# Patient Record
Sex: Female | Born: 1960 | Race: White | Hispanic: No | Marital: Married | Smoking: Never smoker
Health system: Southern US, Community
[De-identification: ages and names within clinical notes are randomized; demographics above are authoritative.]

## PROBLEM LIST (undated history)

## (undated) DIAGNOSIS — M1990 Unspecified osteoarthritis, unspecified site: Secondary | ICD-10-CM

## (undated) DIAGNOSIS — D649 Anemia, unspecified: Secondary | ICD-10-CM

## (undated) DIAGNOSIS — T4145XA Adverse effect of unspecified anesthetic, initial encounter: Secondary | ICD-10-CM

## (undated) DIAGNOSIS — H33309 Unspecified retinal break, unspecified eye: Secondary | ICD-10-CM

## (undated) DIAGNOSIS — T8859XA Other complications of anesthesia, initial encounter: Secondary | ICD-10-CM

## (undated) HISTORY — PX: COLONOSCOPY: SHX174

## (undated) HISTORY — PX: EYE SURGERY: SHX253

## (undated) HISTORY — PX: TRIGGER FINGER RELEASE: SHX641

## (undated) HISTORY — PX: CHOLECYSTECTOMY: SHX55

---
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
## 2005-11-01 ENCOUNTER — Ambulatory Visit: Payer: Self-pay | Admitting: Internal Medicine

## 2008-04-09 ENCOUNTER — Ambulatory Visit: Payer: Self-pay | Admitting: Family Medicine

## 2010-04-22 ENCOUNTER — Ambulatory Visit: Payer: Self-pay | Admitting: Family Medicine

## 2012-07-23 ENCOUNTER — Ambulatory Visit: Payer: Self-pay | Admitting: Internal Medicine

## 2012-09-17 ENCOUNTER — Ambulatory Visit: Payer: Self-pay | Admitting: Internal Medicine

## 2013-06-06 ENCOUNTER — Emergency Department (HOSPITAL_COMMUNITY)
Admission: EM | Admit: 2013-06-06 | Discharge: 2013-06-06 | Disposition: A | Discharge status: Home / Self Care | Payer: BC Managed Care – PPO | Attending: Emergency Medicine | Admitting: Emergency Medicine

## 2013-06-06 ENCOUNTER — Encounter (HOSPITAL_COMMUNITY): Payer: Self-pay | Admitting: *Deleted

## 2013-06-06 DIAGNOSIS — H43391 Other vitreous opacities, right eye: Secondary | ICD-10-CM

## 2013-06-06 DIAGNOSIS — Z7982 Long term (current) use of aspirin: Secondary | ICD-10-CM | POA: Insufficient documentation

## 2013-06-06 DIAGNOSIS — Z79899 Other long term (current) drug therapy: Secondary | ICD-10-CM | POA: Insufficient documentation

## 2013-06-06 DIAGNOSIS — H43399 Other vitreous opacities, unspecified eye: Secondary | ICD-10-CM | POA: Insufficient documentation

## 2013-06-06 DIAGNOSIS — Z9889 Other specified postprocedural states: Secondary | ICD-10-CM | POA: Insufficient documentation

## 2013-06-06 DIAGNOSIS — Z8669 Personal history of other diseases of the nervous system and sense organs: Secondary | ICD-10-CM | POA: Insufficient documentation

## 2013-06-06 DIAGNOSIS — Z880 Allergy status to penicillin: Secondary | ICD-10-CM | POA: Insufficient documentation

## 2013-06-06 DIAGNOSIS — H539 Unspecified visual disturbance: Secondary | ICD-10-CM | POA: Insufficient documentation

## 2013-06-06 HISTORY — DX: Unspecified retinal break, unspecified eye: H33.309

## 2013-06-06 NOTE — ED Provider Notes (Signed)
History     CSN: [id number]  Arrival date & time 06/06/13  2038   First MD Initiated Contact with Patient 06/06/13 2144      Chief Complaint  Patient presents with  . Eye Injury    (Consider location/radiation/quality/duration/timing/severity/associated sxs/prior treatment) Patient is a 52 y.o. female presenting with eye injury. The history is provided by the patient.  Eye Injury This is a new problem. The current episode started today. The problem occurs constantly. The problem has been unchanged. Associated symptoms include a visual change (flashes and floaters, no blurry vision). Pertinent negatives include no abdominal pain, chest pain, chills, fever, headaches, nausea, numbness, vomiting or weakness. Nothing aggravates the symptoms. She has tried nothing for the symptoms.    Past Medical History  Diagnosis Date  . Retinal defect, unspecified     Past Surgical History  Procedure Laterality Date  . Eye surgery    . Cholecystectomy      History reviewed. No pertinent family history.  History  Substance Use Topics  . Smoking status: Never Smoker   . Smokeless tobacco: Not on file  . Alcohol Use: No    OB History   Grav Para Term Preterm Abortions TAB SAB Ect Mult Living                  Review of Systems  Constitutional: Negative for fever and chills.  Eyes: Positive for visual disturbance. Negative for photophobia, pain, discharge and redness.  Respiratory: Negative for chest tightness and shortness of breath.   Cardiovascular: Negative for chest pain.  Gastrointestinal: Negative for nausea, vomiting, abdominal pain and diarrhea.  Genitourinary: Negative for dysuria.  Neurological: Negative for dizziness, weakness, numbness and headaches.  All other systems reviewed and are negative.    Allergies  Penicillins and Sulfa antibiotics  Home Medications   Current Outpatient Rx  Name  Route  Sig  Dispense  Refill  . aspirin 81 MG tablet   Oral   Take 81  mg by mouth daily.         Marland Kitchen CALCIUM PO   Oral   Take 1 tablet by mouth daily.         Marland Kitchen MAGNESIUM PO   Oral   Take 1 tablet by mouth daily.         . Multiple Vitamins-Minerals (MULTIVITAMIN WITH MINERALS) tablet   Oral   Take 1 tablet by mouth daily.         . naproxen (NAPROSYN) 500 MG tablet   Oral   Take 500 mg by mouth 2 (two) times daily with a meal.         . Potassium (POTASSIMIN PO)   Oral   Take 1 tablet by mouth daily.           BP 115/87  Pulse 65  Temp(Src) 98.3 F (36.8 C) (Oral)  Resp 16  SpO2 98%  Physical Exam  Nursing note and vitals reviewed. Constitutional: She is oriented to person, place, and time. She appears well-developed and well-nourished. No distress.  HENT:  Head: Normocephalic and atraumatic.  Mouth/Throat: Oropharynx is clear and moist.  Eyes: EOM are normal. Pupils are equal, round, and reactive to light.  Visual acuity 20/25 in right eye. No pain with indirect consensual response. Conjunctivae normal. Unable to visualize retinal detachment or hemorrhage, fundoscopic exam with no abnormalities.   Neck: Normal range of motion. Neck supple.  Cardiovascular: Normal rate, regular rhythm and normal heart sounds.  Exam reveals no  friction rub.   No murmur heard. Pulmonary/Chest: Effort normal and breath sounds normal. No respiratory distress. She has no wheezes. She has no rales.  Abdominal: Soft. There is no tenderness. There is no rebound and no guarding.  Musculoskeletal: Normal range of motion. She exhibits no edema and no tenderness.  Lymphadenopathy:    She has no cervical adenopathy.  Neurological: She is alert and oriented to person, place, and time.  Skin: Skin is warm and dry. No rash noted.  Psychiatric: She has a normal mood and affect. Her behavior is normal.    ED Course  Procedures (including critical care time)  Labs Reviewed - No data to display No results found.   1. Floaters, right   2. Visual  disturbance       MDM  27:11 PM 52 year old female is no significant past medical history presenting with sudden onset of flashes and floaters in her right eye roughly 2 hours prior to arrival. The flashes have resolved but the floaters persist. She denies eye pain, visual change, headache, nausea or vomiting. No history of similar symptoms in the past. She appears well on exam. Visual acuity is intact. Exam not consistent with glaucoma. Discussed with ophthalmologist, Dr. Randon Goldsmith over the phone. Recommended followup in the morning for dilated exam. Consider posterior vitreous detachment versus retinal detachment. Discussed with patient and she is understanding of plan. His clinic will contact her tomorrow morning. She was provided clinic number if she does not hear from them. She was given return precautions and discharged home stable condition.        Caren Hazy, MD 06/06/13 2322

## 2013-06-06 NOTE — ED Notes (Addendum)
Pt has his of retinal issues.  States that she had flashes and a pop in her (R) eye.  Pt was told by her eye doctor to come to the ED.  Pt now reports seeing 'blood streaks and black spots' in her (R) eye.  Vision in (L) eye is normal.  Pt reports being able to see out of her (R) eye but vision is hazy.  Pt denies HA.  PERRLA.

## 2013-06-09 NOTE — ED Provider Notes (Signed)
I saw and evaluated the patient, reviewed the resident's note and I agree with the findings and plan.  Patient presents with painless vision change associated with flashers and floaters. Suspect vitreous detachment versus retinal detachment. Arrangements made for followup with ophthalmology.  Gilda Crease, MD 06/09/13 [phone]

## 2016-08-29 DIAGNOSIS — C4491 Basal cell carcinoma of skin, unspecified: Secondary | ICD-10-CM

## 2016-08-29 HISTORY — DX: Basal cell carcinoma of skin, unspecified: C44.91

## 2017-04-06 ENCOUNTER — Emergency Department: Payer: Self-pay

## 2017-04-06 ENCOUNTER — Emergency Department
Admission: EM | Admit: 2017-04-06 | Discharge: 2017-04-06 | Disposition: A | Discharge status: Home / Self Care | Payer: BLUE CROSS/BLUE SHIELD | Attending: Emergency Medicine | Admitting: Emergency Medicine

## 2017-04-06 ENCOUNTER — Telehealth: Payer: Self-pay

## 2017-04-06 ENCOUNTER — Encounter: Payer: Self-pay | Admitting: Emergency Medicine

## 2017-04-06 DIAGNOSIS — R1031 Right lower quadrant pain: Secondary | ICD-10-CM | POA: Diagnosis present

## 2017-04-06 DIAGNOSIS — Z79899 Other long term (current) drug therapy: Secondary | ICD-10-CM | POA: Diagnosis not present

## 2017-04-06 DIAGNOSIS — R109 Unspecified abdominal pain: Secondary | ICD-10-CM

## 2017-04-06 DIAGNOSIS — R110 Nausea: Secondary | ICD-10-CM | POA: Diagnosis not present

## 2017-04-06 DIAGNOSIS — Z7982 Long term (current) use of aspirin: Secondary | ICD-10-CM | POA: Insufficient documentation

## 2017-04-06 LAB — COMPREHENSIVE METABOLIC PANEL
ALT: 34 U/L (ref 14–54)
AST: 29 U/L (ref 15–41)
Albumin: 4.6 g/dL (ref 3.5–5.0)
Alkaline Phosphatase: 86 U/L (ref 38–126)
Anion gap: 8 (ref 5–15)
BUN: 17 mg/dL (ref 6–20)
CALCIUM: 9.9 mg/dL (ref 8.9–10.3)
CO2: 24 mmol/L (ref 22–32)
CREATININE: 0.86 mg/dL (ref 0.44–1.00)
Chloride: 104 mmol/L (ref 101–111)
Glucose, Bld: 113 mg/dL — ABNORMAL HIGH (ref 65–99)
Potassium: 4.1 mmol/L (ref 3.5–5.1)
Sodium: 136 mmol/L (ref 135–145)
Total Bilirubin: 0.5 mg/dL (ref 0.3–1.2)
Total Protein: 8.2 g/dL — ABNORMAL HIGH (ref 6.5–8.1)

## 2017-04-06 LAB — CBC
HCT: 40.7 % (ref 35.0–47.0)
Hemoglobin: 13.7 g/dL (ref 12.0–16.0)
MCH: 27.8 pg (ref 26.0–34.0)
MCHC: 33.7 g/dL (ref 32.0–36.0)
MCV: 82.5 fL (ref 80.0–100.0)
PLATELETS: 333 10*3/uL (ref 150–440)
RBC: 4.94 MIL/uL (ref 3.80–5.20)
RDW: 14.7 % — ABNORMAL HIGH (ref 11.5–14.5)
WBC: 13.8 10*3/uL — ABNORMAL HIGH (ref 3.6–11.0)

## 2017-04-06 LAB — URINALYSIS, COMPLETE (UACMP) WITH MICROSCOPIC
Bilirubin Urine: NEGATIVE
GLUCOSE, UA: NEGATIVE mg/dL
HGB URINE DIPSTICK: NEGATIVE
KETONES UR: NEGATIVE mg/dL
LEUKOCYTES UA: NEGATIVE
Nitrite: NEGATIVE
Protein, ur: NEGATIVE mg/dL
Specific Gravity, Urine: 1.003 — ABNORMAL LOW (ref 1.005–1.030)
pH: 6 (ref 5.0–8.0)

## 2017-04-06 LAB — LIPASE, BLOOD: Lipase: 18 U/L (ref 11–51)

## 2017-04-06 NOTE — ED Notes (Signed)
See triage note.  Patient sent by pcp for eval of right lower quad discomfort.  She had ct done already and has disc with her.  Pt in nad. Came from work.

## 2017-04-06 NOTE — Consult Note (Signed)
Date of Consultation:  04/06/2017  Requesting Physician:  Charlotte Crumb, MD  Reason for Consultation:  Abdominal pain  History of Present Illness: Holly Holloway is a 56 y.o. female who presents with a one month history of right lower quadrant dull pain.  She reports that her pain started suddenly a month ago.  She was uncertain what it was and took two vicodins and the pain improved to a low-grade pain.  However, this has persisted and not fully resolved.  She reports that she has mild nausea in the morning when she first wakes up, but otherwise no other symptoms.  Denies any fevers or chills.  Denies any other nausea or vomiting.  Denies other areas of abdominal pain, constipation, diarrhea.  Does have occasional blood per rectum but she attributes it to an anal fissure.  Denies any dysuria or hematuria.  Denies any vaginal discharge.  The pain does not radiate.  She went to her PCP about 1-2 weeks ago and had a CT scan ordered, which was done yesterday.  Then the patient had a phone call this morning stating that her CT scan showed appendicitis and the patient was told to come to ED for further evaluation.   Past Medical History: Past Medical History:  Diagnosis Date  . Retinal defect, unspecified      Past Surgical History: Past Surgical History:  Procedure Laterality Date  . CHOLECYSTECTOMY    . EYE SURGERY      Home Medications: Prior to Admission medications   Medication Sig Start Date End Date Taking? Authorizing Provider  aspirin 81 MG tablet Take 81 mg by mouth daily.    Historical Provider, MD  CALCIUM PO Take 1 tablet by mouth daily.    Historical Provider, MD  MAGNESIUM PO Take 1 tablet by mouth daily.    Historical Provider, MD  Multiple Vitamins-Minerals (MULTIVITAMIN WITH MINERALS) tablet Take 1 tablet by mouth daily.    Historical Provider, MD  naproxen (NAPROSYN) 500 MG tablet Take 500 mg by mouth 2 (two) times daily with a meal.    Historical Provider, MD   Potassium (POTASSIMIN PO) Take 1 tablet by mouth daily.    Historical Provider, MD    Allergies: Allergies  Allergen Reactions  . Penicillins     Hives   . Sulfa Antibiotics     hives    Social History:  reports that she has never smoked. She has never used smokeless tobacco. She reports that she does not drink alcohol or use drugs.   Family History: --No family history of IBD.  Review of Systems: Review of Systems  Constitutional: Negative for chills and fever.  HENT: Negative for hearing loss.   Eyes: Negative for blurred vision.  Respiratory: Negative for cough and shortness of breath.   Cardiovascular: Negative for chest pain and leg swelling.  Gastrointestinal: Negative for abdominal pain, constipation, diarrhea, nausea and vomiting.  Genitourinary: Negative for dysuria and hematuria.  Musculoskeletal: Negative for myalgias.  Skin: Negative for rash.  Neurological: Negative for dizziness.  Psychiatric/Behavioral: Negative for depression.  All other systems reviewed and are negative.   Physical Exam BP (!) 145/80 (BP Location: Left Arm)   Pulse 71   Temp 98.1 F (36.7 C) (Oral)   Resp 18   Ht 5\' 8"  (1.727 m)   Wt 97.5 kg (215 lb)   SpO2 93%   BMI 32.69 kg/m  CONSTITUTIONAL: No acute distress HEENT:  Normocephalic, atraumatic, extraocular motion intact. NECK: Trachea is midline, and  there is no jugular venous distension.  RESPIRATORY:  Lungs are clear, and breath sounds are equal bilaterally. Normal respiratory effort without pathologic use of accessory muscles. CARDIOVASCULAR: Heart is regular without murmurs, gallops, or rubs. GI: The abdomen is soft, non-distended, with mild discomfort with deep palpation of the right lower quadrant.  Describes as 2/10 pain.  No peritoneal signs. There were no palpable masses.  MUSCULOSKELETAL:  Normal muscle strength and tone in all four extremities.  No peripheral edema or cyanosis. SKIN: Skin turgor is normal. There are  no pathologic skin lesions.  NEUROLOGIC:  Motor and sensation is grossly normal.  Cranial nerves are grossly intact. PSYCH:  Alert and oriented to person, place and time. Affect is normal.  Laboratory Analysis: Results for orders placed or performed during the hospital encounter of 04/06/17 (from the past 24 hour(s))  Lipase, blood     Status: None   Collection Time: 04/06/17 12:48 PM  Result Value Ref Range   Lipase 18 11 - 51 U/L  Comprehensive metabolic panel     Status: Abnormal   Collection Time: 04/06/17 12:48 PM  Result Value Ref Range   Sodium 136 135 - 145 mmol/L   Potassium 4.1 3.5 - 5.1 mmol/L   Chloride 104 101 - 111 mmol/L   CO2 24 22 - 32 mmol/L   Glucose, Bld 113 (H) 65 - 99 mg/dL   BUN 17 6 - 20 mg/dL   Creatinine, Ser 0.86 0.44 - 1.00 mg/dL   Calcium 9.9 8.9 - 10.3 mg/dL   Total Protein 8.2 (H) 6.5 - 8.1 g/dL   Albumin 4.6 3.5 - 5.0 g/dL   AST 29 15 - 41 U/L   ALT 34 14 - 54 U/L   Alkaline Phosphatase 86 38 - 126 U/L   Total Bilirubin 0.5 0.3 - 1.2 mg/dL   GFR calc non Af Amer >60 >60 mL/min   GFR calc Af Amer >60 >60 mL/min   Anion gap 8 5 - 15  CBC     Status: Abnormal   Collection Time: 04/06/17 12:48 PM  Result Value Ref Range   WBC 13.8 (H) 3.6 - 11.0 K/uL   RBC 4.94 3.80 - 5.20 MIL/uL   Hemoglobin 13.7 12.0 - 16.0 g/dL   HCT 40.7 35.0 - 47.0 %   MCV 82.5 80.0 - 100.0 fL   MCH 27.8 26.0 - 34.0 pg   MCHC 33.7 32.0 - 36.0 g/dL   RDW 14.7 (H) 11.5 - 14.5 %   Platelets 333 150 - 440 K/uL  Urinalysis, Complete w Microscopic     Status: Abnormal   Collection Time: 04/06/17 12:48 PM  Result Value Ref Range   Color, Urine STRAW (A) YELLOW   APPearance CLEAR (A) CLEAR   Specific Gravity, Urine 1.003 (L) 1.005 - 1.030   pH 6.0 5.0 - 8.0   Glucose, UA NEGATIVE NEGATIVE mg/dL   Hgb urine dipstick NEGATIVE NEGATIVE   Bilirubin Urine NEGATIVE NEGATIVE   Ketones, ur NEGATIVE NEGATIVE mg/dL   Protein, ur NEGATIVE NEGATIVE mg/dL   Nitrite NEGATIVE NEGATIVE    Leukocytes, UA NEGATIVE NEGATIVE   RBC / HPF 0-5 0 - 5 RBC/hpf   WBC, UA 0-5 0 - 5 WBC/hpf   Bacteria, UA RARE (A) NONE SEEN   Squamous Epithelial / LPF 0-5 (A) NONE SEEN    Imaging: CT scan from outside facility imported onto EPIC, showing a dilated proximal appendix, with very mild surrounding stranding.  Assessment and Plan: This is  a 56 y.o. female who presents with a one-month history of abdominal pain.  I have independently viewed the patient's imaging and laboratory studies.  Her CT scan is fairly unremarkable, with only minimal periappendiceal stranding.  Oral contrast flows through small bowel without any evidence of obstruction.  There is no significant free fluid.  She's s/p cholecystectomy.  She does have a mildly elevated WBC but this is of unclear etiology.  Given the duration of symptoms and low-grade quality, this is not suspicious for acute appendicitis.  Discussed with the patient the overall progression of appendicitis pathology and her symptoms do not correlate.  Uncertain of the etiology of her current low-grade dull pain.  Could potentially be gynecological in origin, and she may benefit from a pelvic or transvaginal ultrasound for better evaluation.  Overall, there are no acute surgical needs at this point and the patient does not need admission to surgical service.  Did discuss with the patient that if her symptoms were to worsen, to seek medical attention promptly.   Melvyn Neth, [place]

## 2017-04-06 NOTE — Telephone Encounter (Signed)
Primary care Physician called with patient's CT results of acute appendicitis. Physician was instructed to send patient straight to Emergency Department at [facility]. Dr. Hampton Abbot (Surgeon) has been made aware that patient is en route to ED.

## 2017-04-06 NOTE — ED Notes (Signed)
Pt discharged home after verbalizing understanding of discharge instructions; nad noted. 

## 2017-04-06 NOTE — ED Provider Notes (Signed)
[facility] Emergency Department Provider Note  ____________________________________________   I have reviewed the triage vital signs and the nursing notes.   HISTORY  Chief Complaint Abdominal Pain    HPI Holly Holloway is a 56 y.o. female who had gallbladder surgery years ago otherwise has never had abdominal surgery she states. She's had right lower quadrant pain on for a month. She has significant episode of right lower quadrant pain one month ago and then gradually the pain has persisted but not worsening. She has had no fever no vomiting and the pain is a 1 or 2 out of 10. She denies any dysuria or urinary frequency. She's had slightly loose stools. She denies any headache, she denies any cough or chest pain or shortness of breath. This is lower abdominal pain. She went to her primary care doctor about a week ago, they ordered an outpatient CT scan which was done yesterday. They called her today and told her she had appendicitis. Patient was hoping she states to be met by the surgeon at the door of the emergency room. She is upset that this does not happen. Apparently, triage called surgery and surgery stated that they wanted her seen in the emergency room first. This is not a direct admit. Before taking care of the patient therefore I did explain to her that there would be ER charges and she did elect to stay in the emergency department.  Past Medical History:  Diagnosis Date  . Retinal defect, unspecified     There are no active problems to display for this patient.   Past Surgical History:  Procedure Laterality Date  . CHOLECYSTECTOMY    . EYE SURGERY      Prior to Admission medications   Medication Sig Start Date End Date Taking? Authorizing Provider  aspirin 81 MG tablet Take 81 mg by mouth daily.    Historical Provider, MD  CALCIUM PO Take 1 tablet by mouth daily.    Historical Provider, MD  MAGNESIUM PO Take 1 tablet by mouth daily.     Historical Provider, MD  Multiple Vitamins-Minerals (MULTIVITAMIN WITH MINERALS) tablet Take 1 tablet by mouth daily.    Historical Provider, MD  naproxen (NAPROSYN) 500 MG tablet Take 500 mg by mouth 2 (two) times daily with a meal.    Historical Provider, MD  Potassium (POTASSIMIN PO) Take 1 tablet by mouth daily.    Historical Provider, MD    Allergies Penicillins and Sulfa antibiotics  History reviewed. No pertinent family history.  Social History Social History  Substance Use Topics  . Smoking status: Never Smoker  . Smokeless tobacco: Never Used  . Alcohol use No    Review of Systems Constitutional: No fever/chills Eyes: No visual changes. ENT: No sore throat. No stiff neck no neck pain Cardiovascular: Denies chest pain. Respiratory: Denies shortness of breath. Gastrointestinal:   no vomiting.  No diarrhea.  No constipation. Genitourinary: Negative for dysuria. Musculoskeletal: Negative lower extremity swelling Skin: Negative for rash. Neurological: Negative for severe headaches, focal weakness or numbness. 10-point ROS otherwise negative.  ____________________________________________   PHYSICAL EXAM:  VITAL SIGNS: ED Triage Vitals  Enc Vitals Group     BP 04/06/17 1249 (!) 145/80     Pulse Rate 04/06/17 1249 71     Resp 04/06/17 1249 18     Temp 04/06/17 1249 98.1 F (36.7 C)     Temp Source 04/06/17 1249 Oral     SpO2 04/06/17 1249 93 %  Weight 04/06/17 1250 215 lb (97.5 kg)     Height 04/06/17 1250 5\' 8"  (1.727 m)     Head Circumference --      Peak Flow --      Pain Score 04/06/17 1249 1     Pain Loc --      Pain Edu? --      Excl. in GC? --     Constitutional: Alert and oriented. Well appearing and in no acute distress. Eyes: Conjunctivae are normal. PERRL. EOMI. Head: Atraumatic. Nose: No congestion/rhinnorhea. Mouth/Throat: Mucous membranes are moist.  Oropharynx non-erythematous. Neck: No stridor.   Nontender with no  meningismus Cardiovascular: Normal rate, regular rhythm. Grossly normal heart sounds.  Good peripheral circulation. Respiratory: Normal respiratory effort.  No retractions. Lungs CTAB. Abdominal: Soft and Tenderness to palpation minimal in the right lower quadrant no peritoneal signs. No distention. No guarding no rebound Back:  There is no focal tenderness or step off.  there is no midline tenderness there are no lesions noted. there is no CVA tenderness GU: Patient declines Musculoskeletal: No lower extremity tenderness, no upper extremity tenderness. No joint effusions, no DVT signs strong distal pulses no edema Neurologic:  Normal speech and language. No gross focal neurologic deficits are appreciated.  Skin:  Skin is warm, dry and intact. No rash noted. Psychiatric: Mood and affect are normal. Speech and behavior are normal.  ____________________________________________   LABS (all labs ordered are listed, but only abnormal results are displayed)  Labs Reviewed  COMPREHENSIVE METABOLIC PANEL - Abnormal; Notable for the following:       Result Value   Glucose, Bld 113 (*)    Total Protein 8.2 (*)    All other components within normal limits  CBC - Abnormal; Notable for the following:    WBC 13.8 (*)    RDW 14.7 (*)    All other components within normal limits  URINALYSIS, COMPLETE (UACMP) WITH MICROSCOPIC - Abnormal; Notable for the following:    Color, Urine STRAW (*)    APPearance CLEAR (*)    Specific Gravity, Urine 1.003 (*)    Bacteria, UA RARE (*)    Squamous Epithelial / LPF 0-5 (*)    All other components within normal limits  URINE CULTURE  LIPASE, BLOOD   ____________________________________________  EKG  I personally interpreted any EKGs ordered by me or triage  ____________________________________________  RADIOLOGY  I reviewed any imaging ordered by me or triage that were performed during my shift and, if possible, patient and/or family made aware of any  abnormal findings. ____________________________________________   PROCEDURES  Procedure(s) performed: None  Procedures  Critical Care performed: None  ____________________________________________   INITIAL IMPRESSION / ASSESSMENT AND PLAN / ED COURSE  Pertinent labs & imaging results that were available during my care of the patient were reviewed by me and considered in my medical decision making (see chart for details).  Administration with 1 month of mild right lower quadrant abdominal pain. She is postmenopausal. She has had no fever no chills. The patient's CT scan was read as possible appendicitis. White count is 13.8. She has no peritoneal signs. Symptoms were there for one month without interruption. He was no crescendoing or worsening symptoms. She is using and drinking with no difficulty. Clinically, this is very low suspicion therefore for appendicitis however she does have right lower quadrant pain and her white count is somewhat elevated.   Accordingly, I was able to upload the images of the CT scan into  radiology systems here, and we also had the surgeon consulted. She was evaluated by the surgery team here. Surgery does not feel that she has appendicitis. Her exam is quite benign despite a month of symptoms, she has no evidence of a surgical abdomen and her history is reassuring. The surgeon was able to review her history, he was able to review her images with the patient, and he was able to see her vital signs and blood work findings.  Surgery explained to her that she did not meet criteria for admission or surgery at this time. Patient understands. Patient voiced understanding and relief with this. However, that means that we don't know exactly why she is having the pain. I did splint to her that is not possible this could be an early appendicitis that is superimposed upon her chronic abdominal pain. I did also offer her an ultrasound to evaluate ovarian structures and look  for other causes of her right lower quadrant pain,. She refuses a ultrasound at this time. She would prefer not to have any of this done to the ER. She states she will follow closely with her primary care doctor. She refuses any further intervention from me at this time. Patient is somewhat upset that she had an emergency room visit and I did try to explain that with a CT finding of possible sinusitis she did need to come in. Patient is understanding of this apparently. I have given her extensive return precautions and she understands and still need to return if she has fever, increased pain, vomiting or any other signs of deteriorating intra-abdominal pathology. Patient very comfortable with this plan. Given that she refuses pelvic exam and ultrasound, we don't really have much else to offer her at this point except for extensive counseling about the possibility of early appendicitis and the need to return if she feels worse.    ____________________________________________   FINAL CLINICAL IMPRESSION(S) / ED DIAGNOSES  Final diagnoses:  Abdominal pain  Right lower quadrant abdominal pain      This chart was dictated using voice recognition software.  Despite best efforts to proofread,  errors can occur which can change meaning.      Schuyler Amor, MD 04/06/17 [phone]

## 2017-04-06 NOTE — Discharge Instructions (Signed)
You would prefer not to stay for an ultrasound which is not unreasonable but it does limit what we can evaluate you for. If you have increased pain,, vomiting, diarrhea, bleeding, fever or you feel worse in any way return to the emergency room. Otherwise follow closely with primary care doctor.

## 2017-04-06 NOTE — ED Triage Notes (Signed)
Pt states was sent to ED by Dr. Esaw Dace office for evaluation for possible appendicitis. Pt states went to PCP for c/o RLQ pain and had a CT scan done yesterday, pt states was called by Dr. Esaw Dace office this morning and was told to come to ER for further evaluation due to "suspicious appendicitis".

## 2017-04-08 LAB — URINE CULTURE: Culture: NO GROWTH

## 2017-04-10 ENCOUNTER — Other Ambulatory Visit: Payer: Self-pay | Admitting: Internal Medicine

## 2017-04-10 DIAGNOSIS — R102 Pelvic and perineal pain: Secondary | ICD-10-CM

## 2017-04-19 ENCOUNTER — Ambulatory Visit: Payer: BLUE CROSS/BLUE SHIELD

## 2017-04-26 ENCOUNTER — Ambulatory Visit
Admission: RE | Admit: 2017-04-26 | Discharge: 2017-04-26 | Disposition: A | Discharge status: Home / Self Care | Payer: BLUE CROSS/BLUE SHIELD | Source: Ambulatory Visit | Attending: Internal Medicine | Admitting: Internal Medicine

## 2017-04-26 DIAGNOSIS — R102 Pelvic and perineal pain: Secondary | ICD-10-CM | POA: Insufficient documentation

## 2017-04-26 DIAGNOSIS — Z780 Asymptomatic menopausal state: Secondary | ICD-10-CM | POA: Diagnosis not present

## 2018-02-20 IMAGING — US US TRANSVAGINAL NON-OB
1 series · 13 of 25 positions shown · non-contrast
Comparison: Abdominal CT 04/05/2017 at an outside institution

CLINICAL DATA: Pelvic pain. Right lower quadrant pain for 2 months.
Two years post menopausal.

EXAM:
TRANSABDOMINAL AND TRANSVAGINAL ULTRASOUND OF PELVIS
TECHNIQUE: Both transabdominal and transvaginal ultrasound examinations of the
pelvis were performed. Transabdominal technique was performed for
global imaging of the pelvis including uterus, ovaries, adnexal
regions, and pelvic cul-de-sac. It was necessary to proceed with
endovaginal exam following the transabdominal exam to visualize the
uterus and right ovary.

[Series 1: us transvaginal non-ob · 0.21mm/px · 13 of 46 slices shown]
[im 1/46]
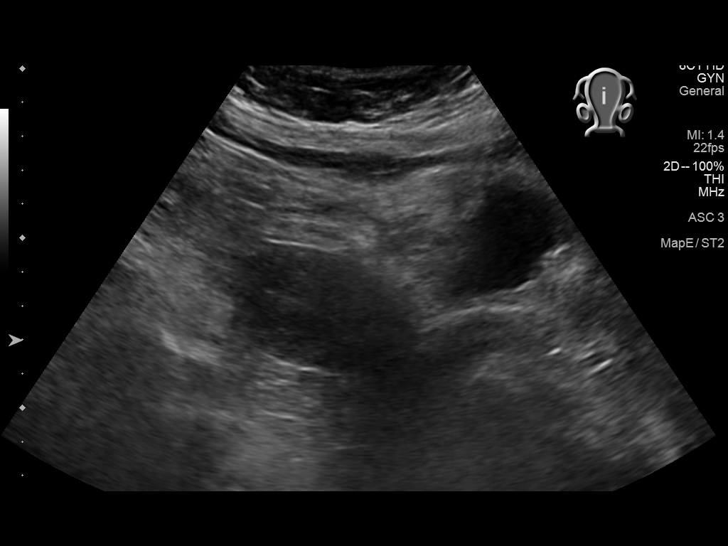
[im 4/46]
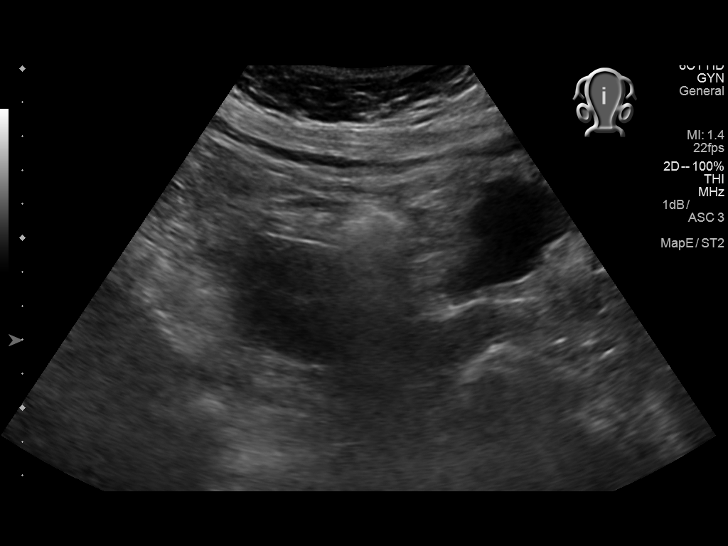
[im 8/46]
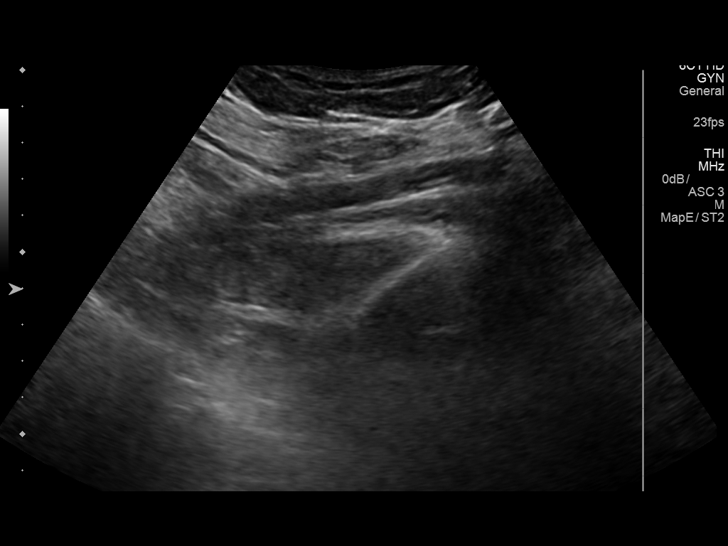
[im 12/46]
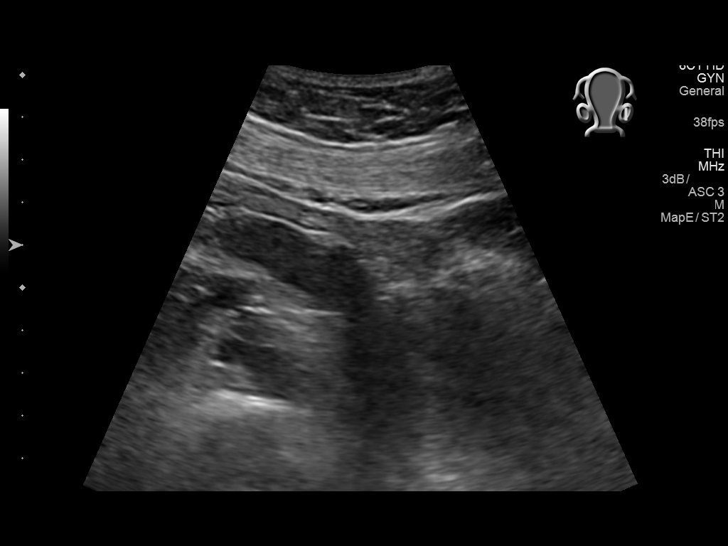
[im 16/46]
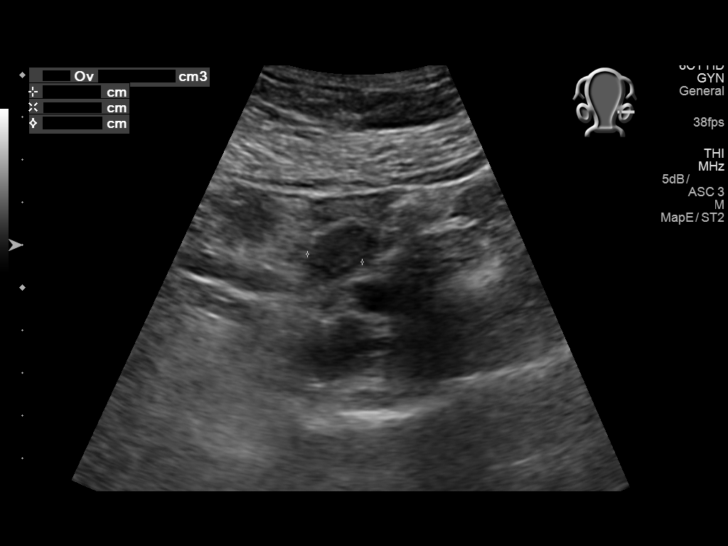
[im 19/46]
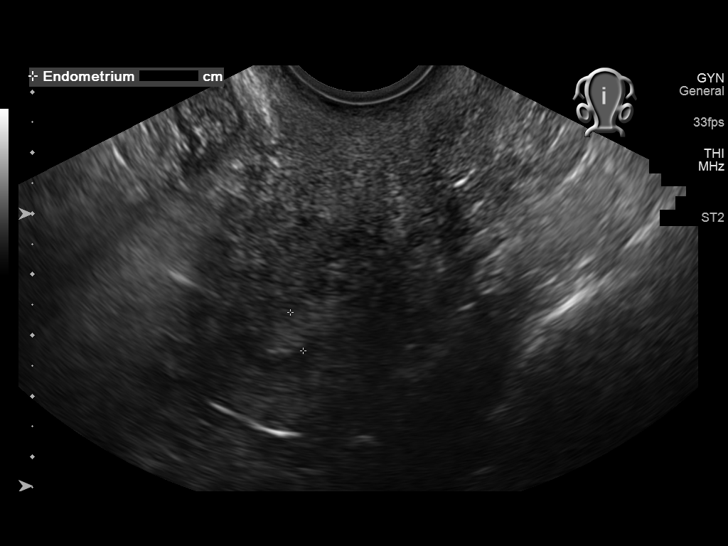
[im 23/46]
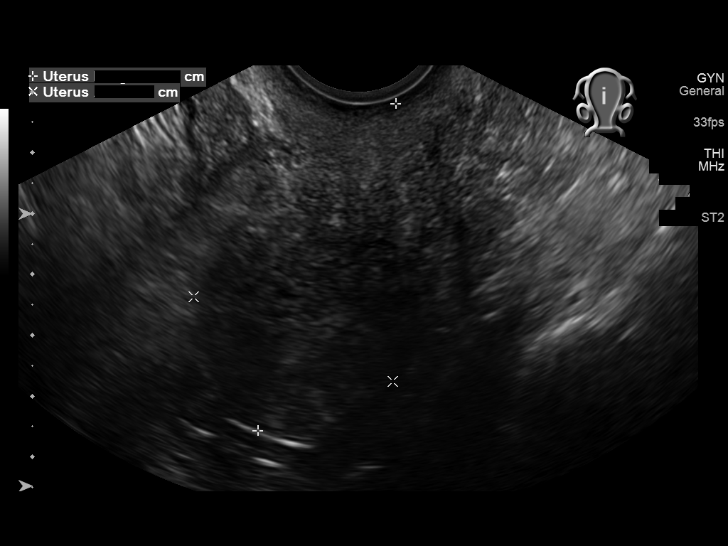
[im 27/46]
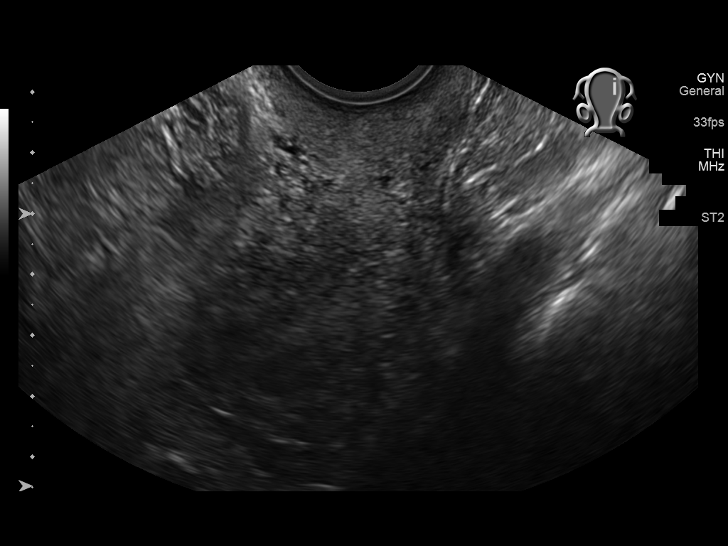
[im 31/46]
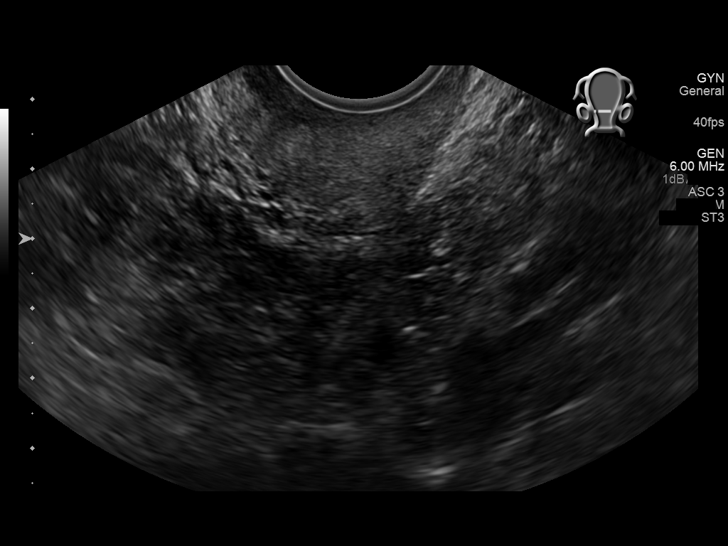
[im 34/46]
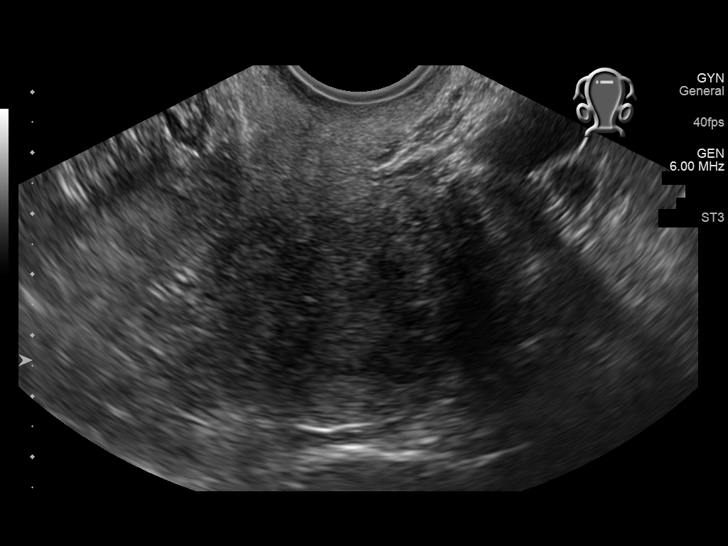
[im 38/46]
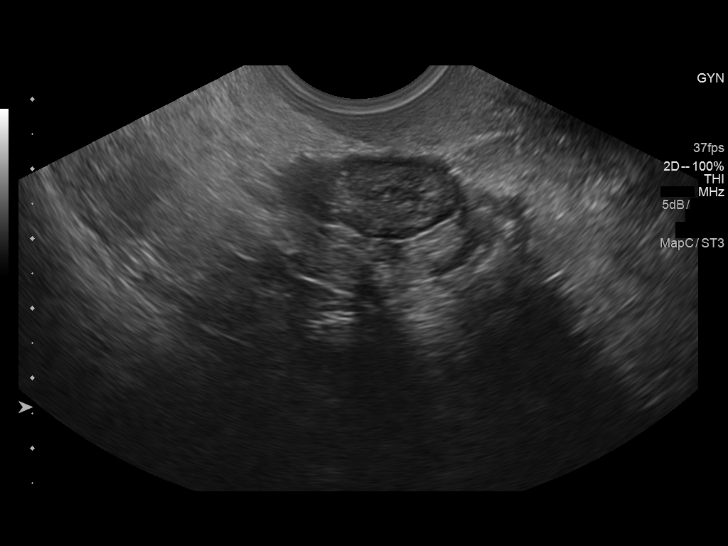
[im 42/46]
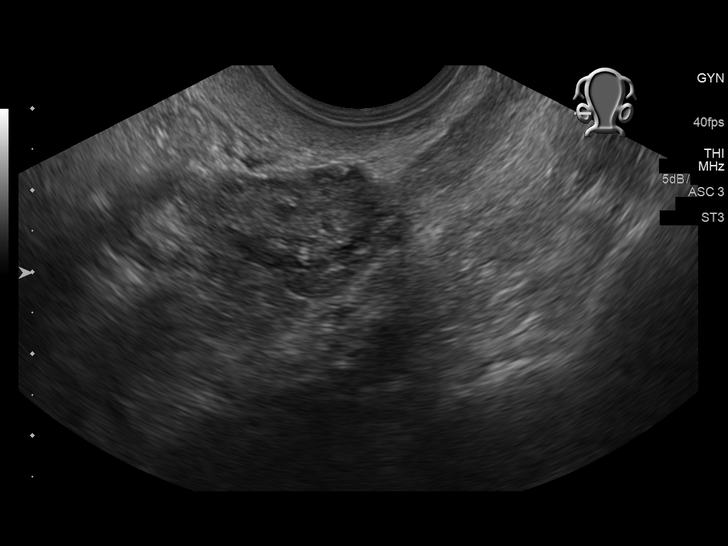
[im 46/46]
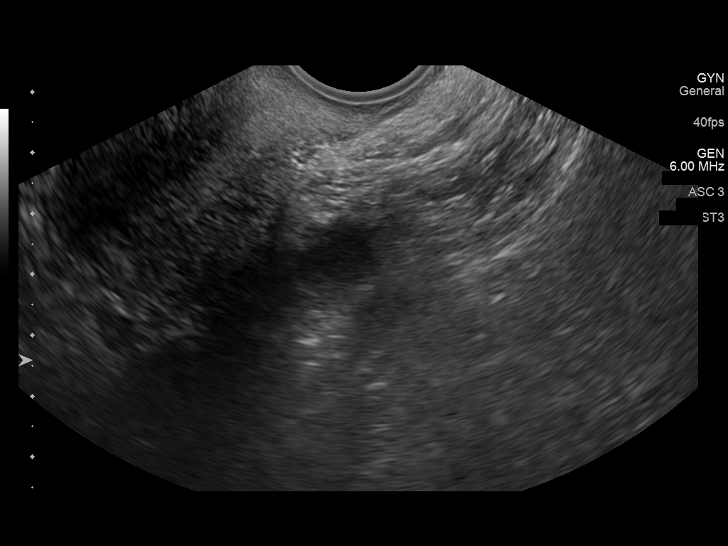

[13 of 25 positions shown; findings below may reference images not displayed]

FINDINGS: Uterus

Measurements: 5.8 x 3.6 x 4.1 cm. No fibroids or other mass
visualized.

Endometrium

Thickness: 6.6 mm.  No focal abnormality visualized.

Right ovary

Measurements: 2.5 x 1.1 x 1.1 cm. Normal quiescent appearance. No
adnexal mass.

Left ovary

Measurements: 3.9 x 1.3 x 1.3 cm. Normal quiescent appearance. No
adnexal mass.

Other findings

No abnormal free fluid.
IMPRESSION: 1. Endometrial thickness of 6.6 mm is prominent for postmenopausal
patient. In the absence of postmenopausal vaginal bleeding this may
be spurious.
2. Normal sonographic appearance of the ovaries.  No adnexal mass.

## 2019-01-29 ENCOUNTER — Ambulatory Visit: Payer: BLUE CROSS/BLUE SHIELD | Admitting: Family Medicine

## 2019-08-01 ENCOUNTER — Other Ambulatory Visit (HOSPITAL_COMMUNITY)
Admission: RE | Admit: 2019-08-01 | Discharge: 2019-08-01 | Disposition: A | Discharge status: Home / Self Care | Payer: HRSA Program | Source: Ambulatory Visit | Attending: Orthopaedic Surgery | Admitting: Orthopaedic Surgery

## 2019-08-01 DIAGNOSIS — Z20828 Contact with and (suspected) exposure to other viral communicable diseases: Secondary | ICD-10-CM | POA: Insufficient documentation

## 2019-08-01 DIAGNOSIS — Z01812 Encounter for preprocedural laboratory examination: Secondary | ICD-10-CM | POA: Diagnosis not present

## 2019-08-02 ENCOUNTER — Encounter (HOSPITAL_COMMUNITY): Payer: Self-pay | Admitting: *Deleted

## 2019-08-02 ENCOUNTER — Other Ambulatory Visit: Payer: Self-pay

## 2019-08-02 LAB — SARS CORONAVIRUS 2 (TAT 6-24 HRS): SARS Coronavirus 2: NEGATIVE

## 2019-08-02 NOTE — Progress Notes (Signed)
Spoke with pt for pre-op call. Pt denies cardiac history, HTN or Diabetes.   Pt lives in [place] and cannot come and pick up the Pre-surgery Ensure drink. Instructed her to drink clear liquids until 4:30 AM Monday. No food after midnight.  Pt had Covid test done yesterday and it is negative. Pt states she has been in quarantine since testing.    Coronavirus Screening  Have you experienced the following symptoms:  Cough NO Fever (>100.38F)  NO Runny nose NO Sore throat NO Difficulty breathing/shortness of breath NO  Have you or a family member traveled in the last 14 days and where? NO  Patient reminded that hospital visitation restrictions are in effect and the importance of the restrictions. Pt informed that she may have 1 visitor sit in the waiting room while she is in pre-op, surgery and PACU. She voiced understanding.

## 2019-08-04 NOTE — Anesthesia Preprocedure Evaluation (Addendum)
Anesthesia Evaluation  Patient identified by MRN, date of birth, ID band Patient awake    Reviewed: Allergy & Precautions, NPO status , Patient's Chart, lab work & pertinent test results  History of Anesthesia Complications Negative for: history of anesthetic complications  Airway Mallampati: II  TM Distance: >3 FB Neck ROM: Full    Dental   Pulmonary neg pulmonary ROS,    Pulmonary exam normal        Cardiovascular negative cardio ROS Normal cardiovascular exam     Neuro/Psych negative neurological ROS  negative psych ROS   GI/Hepatic negative GI ROS, Neg liver ROS,   Endo/Other  negative endocrine ROS  Renal/GU negative Renal ROS  negative genitourinary   Musculoskeletal negative musculoskeletal ROS (+)   Abdominal   Peds  Hematology negative hematology ROS (+)   Anesthesia Other Findings BMI 31  Reproductive/Obstetrics                            Anesthesia Physical Anesthesia Plan  ASA: II  Anesthesia Plan: MAC and Regional   Post-op Pain Management:  Regional for Post-op pain   Induction: Intravenous  PONV Risk Score and Plan: 2 and Propofol infusion, TIVA and Treatment may vary due to age or medical condition  Airway Management Planned: Natural Airway, Nasal Cannula and Simple Face Mask  Additional Equipment: None  Intra-op Plan:   Post-operative Plan:   Informed Consent: I have reviewed the patients History and Physical, chart, labs and discussed the procedure including the risks, benefits and alternatives for the proposed anesthesia with the patient or authorized representative who has indicated his/her understanding and acceptance.       Plan Discussed with:   Anesthesia Plan Comments:       Anesthesia Quick Evaluation

## 2019-08-05 ENCOUNTER — Ambulatory Visit (HOSPITAL_COMMUNITY)
Admission: RE | Admit: 2019-08-05 | Discharge: 2019-08-05 | Disposition: A | Discharge status: Home / Self Care | Payer: Self-pay | Attending: Orthopaedic Surgery | Admitting: Orthopaedic Surgery

## 2019-08-05 ENCOUNTER — Ambulatory Visit (HOSPITAL_COMMUNITY): Payer: Self-pay | Admitting: Anesthesiology

## 2019-08-05 ENCOUNTER — Other Ambulatory Visit: Payer: Self-pay

## 2019-08-05 ENCOUNTER — Encounter (HOSPITAL_COMMUNITY): Payer: Self-pay | Admitting: *Deleted

## 2019-08-05 ENCOUNTER — Encounter (HOSPITAL_COMMUNITY)
Admission: RE | Disposition: A | Discharge status: Home / Self Care | Payer: Self-pay | Source: Home / Self Care | Attending: Orthopaedic Surgery

## 2019-08-05 DIAGNOSIS — M1990 Unspecified osteoarthritis, unspecified site: Secondary | ICD-10-CM | POA: Insufficient documentation

## 2019-08-05 DIAGNOSIS — W19XXXA Unspecified fall, initial encounter: Secondary | ICD-10-CM | POA: Insufficient documentation

## 2019-08-05 DIAGNOSIS — S62617A Displaced fracture of proximal phalanx of left little finger, initial encounter for closed fracture: Secondary | ICD-10-CM | POA: Insufficient documentation

## 2019-08-05 HISTORY — PX: OPEN REDUCTION INTERNAL FIXATION (ORIF) PROXIMAL PHALANX: SHX6235

## 2019-08-05 HISTORY — DX: Anemia, unspecified: D64.9

## 2019-08-05 HISTORY — DX: Unspecified osteoarthritis, unspecified site: M19.90

## 2019-08-05 HISTORY — DX: Other complications of anesthesia, initial encounter: T88.59XA

## 2019-08-05 SURGERY — OPEN REDUCTION INTERNAL FIXATION (ORIF) PROXIMAL PHALANX
Anesthesia: Monitor Anesthesia Care | Laterality: Left

## 2019-08-05 MED ORDER — MIDAZOLAM HCL 2 MG/2ML IJ SOLN
INTRAMUSCULAR | Status: AC
  Filled 2019-08-05: qty 2

## 2019-08-05 MED ORDER — PHENYLEPHRINE HCL (PRESSORS) 10 MG/ML IV SOLN
INTRAVENOUS | Status: DC | PRN
  Administered 2019-08-05: 80 ug via INTRAVENOUS

## 2019-08-05 MED ORDER — 0.9 % SODIUM CHLORIDE (POUR BTL) OPTIME
TOPICAL | Status: DC | PRN
  Administered 2019-08-05: 1000 mL

## 2019-08-05 MED ORDER — FENTANYL CITRATE (PF) 250 MCG/5ML IJ SOLN
INTRAMUSCULAR | Status: AC
  Filled 2019-08-05: qty 5

## 2019-08-05 MED ORDER — PROPOFOL 10 MG/ML IV BOLUS
INTRAVENOUS | Status: AC
  Filled 2019-08-05: qty 40

## 2019-08-05 MED ORDER — CEFAZOLIN SODIUM-DEXTROSE 2-4 GM/100ML-% IV SOLN
INTRAVENOUS | Status: AC
  Filled 2019-08-05: qty 100

## 2019-08-05 MED ORDER — CHLORHEXIDINE GLUCONATE 4 % EX LIQD: 60.0000 mL | Freq: Once | CUTANEOUS | Status: DC

## 2019-08-05 MED ORDER — HYDROCODONE-ACETAMINOPHEN 5-325 MG PO TABS: 1.0000 | ORAL_TABLET | Freq: Four times a day (QID) | ORAL | 0 refills | Status: AC | PRN

## 2019-08-05 MED ORDER — ROPIVACAINE HCL 5 MG/ML IJ SOLN
INTRAMUSCULAR | Status: DC | PRN
  Administered 2019-08-05: 30 mL via PERINEURAL

## 2019-08-05 MED ORDER — BUPIVACAINE HCL (PF) 0.25 % IJ SOLN
INTRAMUSCULAR | Status: AC
  Filled 2019-08-05: qty 30

## 2019-08-05 MED ORDER — CEFAZOLIN SODIUM-DEXTROSE 2-4 GM/100ML-% IV SOLN
2.0000 g | INTRAVENOUS | Status: AC
  Administered 2019-08-05: 2 g via INTRAVENOUS

## 2019-08-05 MED ORDER — FENTANYL CITRATE (PF) 100 MCG/2ML IJ SOLN
INTRAMUSCULAR | Status: DC | PRN
  Administered 2019-08-05: 50 ug via INTRAVENOUS

## 2019-08-05 MED ORDER — POVIDONE-IODINE 10 % EX SWAB: 2.0000 "application " | Freq: Once | CUTANEOUS | Status: DC

## 2019-08-05 MED ORDER — PROPOFOL 500 MG/50ML IV EMUL
INTRAVENOUS | Status: DC | PRN
  Administered 2019-08-05: 50 ug/kg/min via INTRAVENOUS

## 2019-08-05 MED ORDER — MIDAZOLAM HCL 5 MG/5ML IJ SOLN
INTRAMUSCULAR | Status: DC | PRN
  Administered 2019-08-05 (×2): 1 mg via INTRAVENOUS

## 2019-08-05 MED ORDER — LACTATED RINGERS IV SOLN
INTRAVENOUS | Status: DC | PRN
  Administered 2019-08-05: 07:00:00 via INTRAVENOUS

## 2019-08-05 MED ORDER — ENSURE PRE-SURGERY PO LIQD: 296.0000 mL | Freq: Once | ORAL | Status: DC

## 2019-08-05 MED ORDER — ONDANSETRON HCL 4 MG/2ML IJ SOLN
INTRAMUSCULAR | Status: DC | PRN
  Administered 2019-08-05: 4 mg via INTRAVENOUS

## 2019-08-05 SURGICAL SUPPLY — 50 items
BLADE CLIPPER SURG (BLADE) IMPLANT
BNDG ELASTIC 3X5.8 VLCR STR LF (GAUZE/BANDAGES/DRESSINGS) ×3 IMPLANT
BNDG ELASTIC 4X5.8 VLCR STR LF (GAUZE/BANDAGES/DRESSINGS) ×3 IMPLANT
BNDG ESMARK 4X9 LF (GAUZE/BANDAGES/DRESSINGS) ×3 IMPLANT
BNDG GAUZE ELAST 4 BULKY (GAUZE/BANDAGES/DRESSINGS) ×9 IMPLANT
CORD BIPOLAR FORCEPS 12FT (ELECTRODE) ×3 IMPLANT
COVER SURGICAL LIGHT HANDLE (MISCELLANEOUS) ×3 IMPLANT
COVER WAND RF STERILE (DRAPES) ×3 IMPLANT
CUFF TOURN SGL QUICK 18X4 (TOURNIQUET CUFF) ×3 IMPLANT
CUFF TOURN SGL QUICK 24 (TOURNIQUET CUFF)
CUFF TRNQT CYL 24X4X16.5-23 (TOURNIQUET CUFF) IMPLANT
DRAIN TLS ROUND 10FR (DRAIN) IMPLANT
DRAPE OEC MINIVIEW 54X84 (DRAPES) IMPLANT
DRAPE SURG 17X23 STRL (DRAPES) ×3 IMPLANT
DRSG XEROFORM 1X8 (GAUZE/BANDAGES/DRESSINGS) ×2 IMPLANT
GAUZE SPONGE 4X4 12PLY STRL (GAUZE/BANDAGES/DRESSINGS) ×3 IMPLANT
GAUZE SPONGE 4X4 16PLY XRAY LF (GAUZE/BANDAGES/DRESSINGS) ×2 IMPLANT
GAUZE XEROFORM 1X8 LF (GAUZE/BANDAGES/DRESSINGS) ×3 IMPLANT
GLOVE BIOGEL M 8.0 STRL (GLOVE) ×3 IMPLANT
GLOVE SS BIOGEL STRL SZ 8 (GLOVE) ×1 IMPLANT
GLOVE SUPERSENSE BIOGEL SZ 8 (GLOVE) ×2
GOWN STRL REUS W/ TWL LRG LVL3 (GOWN DISPOSABLE) ×3 IMPLANT
GOWN STRL REUS W/ TWL XL LVL3 (GOWN DISPOSABLE) ×3 IMPLANT
GOWN STRL REUS W/TWL LRG LVL3 (GOWN DISPOSABLE) ×6
GOWN STRL REUS W/TWL XL LVL3 (GOWN DISPOSABLE) ×6
KIT BASIN OR (CUSTOM PROCEDURE TRAY) ×3 IMPLANT
KIT TURNOVER KIT B (KITS) ×3 IMPLANT
MANIFOLD NEPTUNE II (INSTRUMENTS) ×3 IMPLANT
NEEDLE 22X1 1/2 (OR ONLY) (NEEDLE) IMPLANT
NS IRRIG 1000ML POUR BTL (IV SOLUTION) ×3 IMPLANT
PACK ORTHO EXTREMITY (CUSTOM PROCEDURE TRAY) ×3 IMPLANT
PAD ARMBOARD 7.5X6 YLW CONV (MISCELLANEOUS) ×6 IMPLANT
PAD CAST 3X4 CTTN HI CHSV (CAST SUPPLIES) ×1 IMPLANT
PAD CAST 4YDX4 CTTN HI CHSV (CAST SUPPLIES) ×1 IMPLANT
PADDING CAST COTTON 3X4 STRL (CAST SUPPLIES) ×2
PADDING CAST COTTON 4X4 STRL (CAST SUPPLIES) ×2
SOL PREP POV-IOD 4OZ 10% (MISCELLANEOUS) ×6 IMPLANT
SPLINT FIBERGLASS 3X12 (CAST SUPPLIES) ×2 IMPLANT
SPONGE LAP 4X18 RFD (DISPOSABLE) IMPLANT
SUT MNCRL AB 4-0 PS2 18 (SUTURE) ×3 IMPLANT
SUT PROLENE 3 0 PS 2 (SUTURE) IMPLANT
SUT VIC AB 3-0 FS2 27 (SUTURE) IMPLANT
SYR CONTROL 10ML LL (SYRINGE) IMPLANT
SYSTEM CHEST DRAIN TLS 7FR (DRAIN) IMPLANT
TOWEL GREEN STERILE (TOWEL DISPOSABLE) ×3 IMPLANT
TOWEL GREEN STERILE FF (TOWEL DISPOSABLE) ×3 IMPLANT
TUBE CONNECTING 12'X1/4 (SUCTIONS) ×1
TUBE CONNECTING 12X1/4 (SUCTIONS) ×2 IMPLANT
TUBE EVACUATION TLS (MISCELLANEOUS) ×3 IMPLANT
WATER STERILE IRR 1000ML POUR (IV SOLUTION) ×3 IMPLANT

## 2019-08-05 NOTE — H&P (Signed)
ORTHOPAEDIC H&P  PCP:  Jodi Marble, MD  Chief Complaint: Left small finger fracture  HPI: Holly Holloway is a 58 y.o. female who complains of  Left small finger fracture. Patient had a fall approximately 1 week ago. She had immediate pain and deformity of the left small finger. She was seen in clinic where she was found to have a displaced left small finger proximal phalanx fracture. She presents today for operative treatment.  Past Medical History:  Diagnosis Date  . Anemia    DURING PREGNANCY  . 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 Complication of anesthesia    very disoriented waking up  . Retinal defect, unspecified    Past Surgical History:  Procedure Laterality Date  . CHOLECYSTECTOMY    . COLONOSCOPY    . EYE SURGERY     retinal detachement surgery on both eyes  . TRIGGER FINGER RELEASE Right    thumb   Social History   Socioeconomic History  . Marital status: Married    Spouse name: Not on file  . Number of children: Not on file  . Years of education: Not on file  . Highest education level: Not on file  Occupational History  . Not on file  Social Needs  . Financial resource strain: Not on file  . Food insecurity    Worry: Not on file    Inability: Not on file  . Transportation needs    Medical: Not on file    Non-medical: Not on file  Tobacco Use  . Smoking status: Never Smoker  . Smokeless tobacco: Never Used  Substance and Sexual Activity  . Alcohol use: No  . Drug use: No  . Sexual activity: Not on file  Lifestyle  . Physical activity    Days per week: Not on file    Minutes per session: Not on file  . Stress: Not on file  Relationships  . Social Herbalist on phone: Not on file    Gets together: Not on file    Attends religious service: Not on file    Active member of club or organization: Not on file    Attends meetings of clubs or organizations: Not on file    Relationship status: Not on file  Other Topics  Concern  . Not on file  Social History Narrative  . Not on file   History reviewed. No pertinent family history. Allergies  Allergen Reactions  . Penicillins Hives, Itching and Other (See Comments)    Did it involve swelling of the face/tongue/throat, SOB, or low BP? No Did it involve sudden or severe rash/hives, skin peeling, or any reaction on the inside of your mouth or nose? #  #  #  YES  #  #  #  Did you need to seek medical attention at a hospital or doctor's office? #  #  #  YES  #  #  #  When did it last happen?30 + years If all above answers are "NO", may proceed with cephalosporin use.   . Sulfa Antibiotics Hives   Prior to Admission medications   Medication Sig Start Date End Date Taking? Authorizing Provider  aspirin 81 MG tablet Take 81 mg by mouth daily.   Yes [provider]  aspirin-acetaminophen-caffeine (EXCEDRIN MIGRAINE) [phone] MG tablet Take 3 tablets by mouth daily as needed for headache.   Yes [provider]  Calcium Carb-Cholecalciferol (CALCIUM 600 +  D PO) Take 1 tablet by mouth daily.   Yes [provider]  ECHINACEA PO Take 760 mg by mouth daily.   Yes [provider]  HYDROcodone-acetaminophen (NORCO/VICODIN) 5-325 MG tablet Take 1 tablet by mouth every 6 (six) hours as needed for moderate pain.   Yes [provider]  L-GLUTAMINE PO Take 500 mg by mouth daily.   Yes [provider]  Multiple Vitamins-Minerals (MULTIVITAMIN WITH MINERALS) tablet Take 1 tablet by mouth daily.   Yes [provider]  naproxen (NAPROSYN) 500 MG tablet Take 500 mg by mouth 2 (two) times daily as needed for moderate pain.    Yes [provider]  Omega-3 Fatty Acids (FISH OIL) 1000 MG CAPS Take 1,000 mg by mouth daily.   Yes [provider]  Turmeric 500 MG TABS Take 500 mg by mouth daily.   Yes [provider]  vitamin C (ASCORBIC ACID) 500 MG tablet Take 500 mg by mouth daily.   Yes  [provider]   No results found.  Positive ROS: All other systems have been reviewed and were otherwise negative with the exception of those mentioned in the HPI and as above.  Physical Exam: General: Alert, no acute distress Cardiovascular: No pedal edema Respiratory: No cyanosis, no use of accessory musculature Skin: No lesions in the area of chief complaint Neurologic: Sensation intact distally Psychiatric: Patient is competent for consent with normal mood and affect Lymphatic: No axillary or cervical lymphadenopathy  MUSCULOSKELETAL: Examination of the left upper extremity shows a mildly swollen left hand. There is some ecchymosis around the dorsal aspect of the hand and small finger. She has tenderness to palpation to the ring and small finger though it is most severe at the proximal phalanx of the small finger. This is isolated to the MP joint mostly. She has pain with flexion and extension of the digit. This includes both passive and active motion. There is no obvious angular deformity of the digit. The fingertips are all warm and well-perfused with brisk capillary refill. Her sensation is intact to light touch throughout all digits.  Assessment: Displaced left small finger proximal phalanx fracture  Plan: - OR today for open vs closed reduction and perc pinning  - r/b/a once again discussed with the patient and consent obtained. Marked - Plan for discharge home post op    Verner Mould, MD Cell [phone]   08/05/2019 7:16 AM

## 2019-08-05 NOTE — Transfer of Care (Signed)
Immediate Anesthesia Transfer of Care Note  Patient: Holly Holloway  Procedure(s) Performed: Left small finger proximal phalanx fracture  closed reduction and surgical fixation (Left )  Patient Location: PACU  Anesthesia Type:MAC and Regional  Level of Consciousness: awake and alert   Airway & Oxygen Therapy: Patient Spontanous Breathing and Patient connected to nasal cannula oxygen  Post-op Assessment: Report given to RN, Post -op Vital signs reviewed and stable and Patient moving all extremities  Post vital signs: Reviewed and stable  Last Vitals:  Vitals Value Taken Time  BP 120/68 08/05/19 0827  Temp    Pulse 60 08/05/19 0827  Resp 20 08/05/19 0827  SpO2 96 % 08/05/19 0827  Vitals shown include unvalidated device data.  Last Pain:  Vitals:   08/05/19 0606  TempSrc: Oral  PainSc:       Patients Stated Pain Goal: 4 (34/19/37 9024)  Complications: No apparent anesthesia complications

## 2019-08-05 NOTE — Discharge Instructions (Signed)
Discharge Instructions  - Keep dressings in place. Do not remove them. - The dressings must stay dry - Take all medication as prescribed. Transition to over the counter pain medication as your pain improves - Keep the hand elevated over the next 48-72 hours to help with pain and swelling - Move all digits not restricted by the dressings regularly to prevent stiffness - Please call to schedule a follow up appointment with Dr. Jeannie Fend at [phone] for 10-14 days following surgery - Your pain medication have been send digitally to your pharmacy

## 2019-08-05 NOTE — Anesthesia Postprocedure Evaluation (Signed)
Anesthesia Post Note  Patient: Holly Holloway  Procedure(s) Performed: Left small finger proximal phalanx fracture  closed reduction and surgical fixation (Left )     Patient location during evaluation: PACU Anesthesia Type: Regional Level of consciousness: awake and alert Pain management: pain level controlled Vital Signs Assessment: post-procedure vital signs reviewed and stable Respiratory status: spontaneous breathing, nonlabored ventilation and respiratory function stable Cardiovascular status: blood pressure returned to baseline and stable Postop Assessment: no apparent nausea or vomiting Anesthetic complications: no    Last Vitals:  Vitals:   08/05/19 0830 08/05/19 0845  BP: 120/68   Pulse: 63   Resp: 16   Temp: 36.4 C (!) 36.4 C  SpO2: 96%     Last Pain:  Vitals:   08/05/19 0900  TempSrc:   PainSc: 0-No pain                 Lidia Collum

## 2019-08-05 NOTE — Anesthesia Procedure Notes (Signed)
Anesthesia Regional Block: Supraclavicular block   Pre-Anesthetic Checklist: ,, timeout performed, Correct Patient, Correct Site, Correct Laterality, Correct Procedure, Correct Position, site marked, Risks and benefits discussed,  Surgical consent,  Pre-op evaluation,  At surgeon's request and post-op pain management  Laterality: Left  Prep: chloraprep       Needles:  Injection technique: Single-shot  Needle Type: Echogenic Stimulator Needle     Needle Length: 10cm  Needle Gauge: 21     Additional Needles:   Procedures:,,,, ultrasound used (permanent image in chart),,,,  Narrative:  Start time: 08/05/2019 7:10 AM End time: 08/05/2019 7:14 AM Injection made incrementally with aspirations every 5 mL.  Performed by: Personally  Anesthesiologist: Lidia Collum, MD  Additional Notes: Monitors applied. Injection made in 5cc increments. No resistance to injection. Good needle visualization. Patient tolerated procedure well.

## 2019-08-05 NOTE — Op Note (Signed)
PREOPERATIVE DIAGNOSIS: Displaced left small finger proximal phalanx fracture  POSTOPERATIVE DIAGNOSIS: Same  ATTENDING PHYSICIAN: Maudry Mayhew. Jeannie Fend, III, MD who was present and scrubbed for the entire case   ASSISTANT SURGEON: None.   ANESTHESIA: Regional with MAC  SURGICAL PROCEDURES: Closed reduction percutaneous pin fixation of left small finger proximal phalanx fracture  SURGICAL INDICATIONS: Patient is a 58 year old female who presented to my clinic approximately 1 week after a fall onto an outstretched left hand.  She was seen at outside facility where she was found to have a displaced proximal phalanx fracture to the left small finger.  She was to a splint and sent to see me in clinic.  After reviewing repeat radiographs we did recommend proceeding forward with operative reduction and fixation of her fracture and she presents today for that.  FINDING: There is unstable, displaced fracture of the left small finger proximal phalanx.  Successful closed reduction and percutaneous pin fixation with 0.045 K wires x3 was achieved with near anatomic reduction.  DESCRIPTION OF PROCEDURE: Patient was identified in the preop holding area where the risk benefits and alternatives of the procedure were discussed with patient.  These include but are not limited to infection, bleeding, damage to surrounding structures include blood vessels and nerves, pain, stiffness, malunion, nonunion and need for additional procedures.  Informed consent was obtained at that time patient's left hand was marked with surgical marking pen.  She then underwent a left upper extremity plexus block.  She then was brought to the operative suite where timeout was performed identifying the correct patient operative site.  She was positioned supine on operative table with her hand outstretched on a hand table.  She was induced under MAC sedation.  The left upper extremity was then prepped and draped in usual sterile fashion.  A  gentle closed reduction maneuver was performed with both radial and ulnar deviation through the fracture site of the left small finger proximal phalanx as well as flexion through the fracture site.  Fluoroscopic images were obtained which showed near anatomic alignment of the fracture following closed reduction maneuvers.  A 0.045 K wire was then introduced antegrade starting at the radial aspect of the proximal phalanx at the articular margin.  This was advanced across the fracture site stabilized fracture.  Two 0.045 K wires were then placed ulnarly again advancing these antegrade from the proximal articular margin and advanced into the distal aspect of the proximal phalanx.  Fluoroscopic images were obtained which showed appropriate pin positioning as well as near anatomic alignment of the fracture site.  The pins were bent and cut outside of her skin and covered with pin caps.  Xeroform, 4 x 4's and a well-padded ulnar gutter splint were placed with the MP joints in approximately 50 degrees of flexion.  This was done to both the ring and small finger.  Patient was awoken from anesthesia and taken to the PACU in stable condition.  She tolerated the procedure well and there are no complications.  RADIOGRAPHIC INTERPRETATION: 2 views of the left small finger including PA and lateral images were obtained.  These show near-anatomic alignment of the prior left small finger proximal phalanx fracture.  3 K wires are crossing the fracture site and antegrade fashion.  All joints are reduced.  ESTIMATED BLOOD LOSS: Less than 10 amounts  TOURNIQUET TIME: 0 minutes  SPECIMENS: None  POSTOPERATIVE PLAN: The patient will be discharged home and seen back in the office in approximately 10-12 days for wound  check, suture removal, and then be sent to a therapist for splint fabrication as well as some gentle range of motion exercises  IMPLANTS: 0.045 K wires x3

## 2019-08-06 ENCOUNTER — Encounter (HOSPITAL_COMMUNITY): Payer: Self-pay | Admitting: Orthopaedic Surgery

## 2021-12-02 ENCOUNTER — Encounter: Payer: Self-pay | Admitting: Dermatology

## 2021-12-02 ENCOUNTER — Ambulatory Visit (INDEPENDENT_AMBULATORY_CARE_PROVIDER_SITE_OTHER): Payer: Self-pay | Admitting: Dermatology

## 2021-12-02 ENCOUNTER — Other Ambulatory Visit: Payer: Self-pay

## 2021-12-02 DIAGNOSIS — B356 Tinea cruris: Secondary | ICD-10-CM

## 2021-12-02 DIAGNOSIS — Z1283 Encounter for screening for malignant neoplasm of skin: Secondary | ICD-10-CM

## 2021-12-02 DIAGNOSIS — L821 Other seborrheic keratosis: Secondary | ICD-10-CM

## 2021-12-02 DIAGNOSIS — L814 Other melanin hyperpigmentation: Secondary | ICD-10-CM

## 2021-12-02 DIAGNOSIS — Z85828 Personal history of other malignant neoplasm of skin: Secondary | ICD-10-CM

## 2021-12-02 DIAGNOSIS — L578 Other skin changes due to chronic exposure to nonionizing radiation: Secondary | ICD-10-CM

## 2021-12-02 DIAGNOSIS — D229 Melanocytic nevi, unspecified: Secondary | ICD-10-CM

## 2021-12-02 DIAGNOSIS — D1800 Hemangioma unspecified site: Secondary | ICD-10-CM

## 2021-12-02 DIAGNOSIS — L918 Other hypertrophic disorders of the skin: Secondary | ICD-10-CM

## 2021-12-02 MED ORDER — KETOCONAZOLE 2 % EX CREA: 1.0000 "application " | TOPICAL_CREAM | Freq: Every evening | CUTANEOUS | 3 refills | Status: AC

## 2021-12-02 NOTE — Progress Notes (Signed)
New Patient Visit  Subjective  Holly Holloway is a 60 y.o. female who presents for the following: Total body skin exam (Hx of BCC  left distal dorsum forearm lateral) and check spots (R forehead, months, irritating/R cheek, months, no symptoms/R arm, months, no symptoms). The patient presents for Total-Body Skin Exam (TBSE) for skin cancer screening and mole check.  The patient has spots, moles and lesions to be evaluated, some may be new or changing and the patient has concerns that these could be cancer.  The following portions of the chart were reviewed this encounter and updated as appropriate:   Tobacco   Allergies   Meds   Problems   Med Hx   Surg Hx   Fam Hx      Review of Systems:  No other skin or systemic complaints except as noted in HPI or Assessment and Plan.  Objective  Well appearing patient in no apparent distress; mood and affect are within normal limits.  A full examination was performed including scalp, head, eyes, ears, nose, lips, neck, chest, axillae, abdomen, back, buttocks, bilateral upper extremities, bilateral lower extremities, hands, feet, fingers, toes, fingernails, and toenails. All findings within normal limits unless otherwise noted below.  L buttocks Erythema and scale  left distal dorsum forearm lateral Well healed scar with no evidence of recurrence.    Assessment & Plan   Lentigines - Scattered tan macules - Due to sun exposure - Benign-appearing, observe - Recommend daily broad spectrum sunscreen SPF 30+ to sun-exposed areas, reapply every 2 hours as needed. - Call for any changes  Seborrheic Keratoses - Stuck-on, waxy, tan-brown papules and/or plaques  - Benign-appearing - Discussed benign etiology and prognosis. - Observe - Call for any changes  Melanocytic Nevi - Tan-brown and/or pink-flesh-colored symmetric macules and papules - Benign appearing on exam today - Observation - Call clinic for new or changing moles - Recommend  daily use of broad spectrum spf 30+ sunscreen to sun-exposed areas.   Hemangiomas - Red papules - Discussed benign nature - Observe - Call for any changes  Actinic Damage - Chronic condition, secondary to cumulative UV/sun exposure - diffuse scaly erythematous macules with underlying dyspigmentation - Recommend daily broad spectrum sunscreen SPF 30+ to sun-exposed areas, reapply every 2 hours as needed.  - Staying in the shade or wearing long sleeves, sun glasses (UVA+UVB protection) and wide brim hats (4-inch brim around the entire circumference of the hat) are also recommended for sun protection.  - Call for new or changing lesions.  Skin cancer screening performed today.  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 Qhs to rash on buttocks for at least 6 wks  History of basal cell carcinoma (BCC) left distal dorsum forearm lateral Clear. Observe for recurrence. Call clinic for new or changing lesions.  Recommend regular skin exams, daily broad-spectrum spf 30+ sunscreen use, and photoprotection.    Skin cancer screening  Acrochordons (Skin Tags) - Fleshy, skin-colored pedunculated papules - Benign appearing.  - Observe. - If desired, they can be removed with an in office procedure that is not covered by insurance. - Please call the clinic if you notice any new or changing lesions.  - axilla  Return in about 1 year (around 12/02/2022) for TBSE, Hx of BCC.  I, Sonya Hupman, RMA,  am acting as scribe for Sarina Ser, MD . Documentation: I have reviewed the above documentation for accuracy and completeness, and I agree with the above.  Sarina Ser, MD

## 2021-12-02 NOTE — Patient Instructions (Signed)

## 2021-12-08 ENCOUNTER — Encounter: Payer: Self-pay | Admitting: Dermatology

## 2022-12-22 ENCOUNTER — Ambulatory Visit: Payer: Self-pay | Admitting: Dermatology

## 2023-07-19 ENCOUNTER — Ambulatory Visit (INDEPENDENT_AMBULATORY_CARE_PROVIDER_SITE_OTHER): Payer: Self-pay | Admitting: Dermatology

## 2023-07-19 ENCOUNTER — Encounter: Payer: Self-pay | Admitting: Dermatology

## 2023-07-19 VITALS — BP 156/76 | HR 62

## 2023-07-19 DIAGNOSIS — D1801 Hemangioma of skin and subcutaneous tissue: Secondary | ICD-10-CM

## 2023-07-19 DIAGNOSIS — L578 Other skin changes due to chronic exposure to nonionizing radiation: Secondary | ICD-10-CM

## 2023-07-19 DIAGNOSIS — Z1283 Encounter for screening for malignant neoplasm of skin: Secondary | ICD-10-CM

## 2023-07-19 DIAGNOSIS — D229 Melanocytic nevi, unspecified: Secondary | ICD-10-CM

## 2023-07-19 DIAGNOSIS — L918 Other hypertrophic disorders of the skin: Secondary | ICD-10-CM

## 2023-07-19 DIAGNOSIS — L814 Other melanin hyperpigmentation: Secondary | ICD-10-CM

## 2023-07-19 DIAGNOSIS — D485 Neoplasm of uncertain behavior of skin: Secondary | ICD-10-CM

## 2023-07-19 DIAGNOSIS — Z85828 Personal history of other malignant neoplasm of skin: Secondary | ICD-10-CM

## 2023-07-19 DIAGNOSIS — W908XXA Exposure to other nonionizing radiation, initial encounter: Secondary | ICD-10-CM

## 2023-07-19 DIAGNOSIS — L821 Other seborrheic keratosis: Secondary | ICD-10-CM

## 2023-07-19 NOTE — Progress Notes (Signed)
Follow-Up Visit   Subjective  Holly Holloway is a 62 y.o. female who presents for the following: Skin Cancer Screening and Full Body Skin Exam The patient presents for Total-Body Skin Exam (TBSE) for skin cancer screening and mole check. The patient has spots, moles and lesions to be evaluated, some may be new or changing and the patient may have concern these could be cancer.  The following portions of the chart were reviewed this encounter and updated as appropriate: medications, allergies, medical history  Review of Systems:  No other skin or systemic complaints except as noted in HPI or Assessment and Plan.  Objective  Well appearing patient in no apparent distress; mood and affect are within normal limits.  A full examination was performed including scalp, head, eyes, ears, nose, lips, neck, chest, axillae, abdomen, back, buttocks, bilateral upper extremities, bilateral lower extremities, hands, feet, fingers, toes, fingernails, and toenails. All findings within normal limits unless otherwise noted below.   Relevant physical exam findings are noted in the Assessment and Plan.  R distal dorsum forearm 2.0 cm dispigmented patch with central 0.7 cm brown macule        Assessment & Plan   SKIN CANCER SCREENING PERFORMED TODAY.  ACTINIC DAMAGE - Chronic condition, secondary to cumulative UV/sun exposure - diffuse scaly erythematous macules with underlying dyspigmentation - Recommend daily broad spectrum sunscreen SPF 30+ to sun-exposed areas, reapply every 2 hours as needed.  - Staying in the shade or wearing long sleeves, sun glasses (UVA+UVB protection) and wide brim hats (4-inch brim around the entire circumference of the hat) are also recommended for sun protection.  - Call for new or changing lesions.  LENTIGINES, SEBORRHEIC KERATOSES, HEMANGIOMAS - Benign normal skin lesions - Benign-appearing - Call for any changes  MELANOCYTIC NEVI - Tan-brown and/or  pink-flesh-colored symmetric macules and papules - Benign appearing on exam today - Observation - Call clinic for new or changing moles - Recommend daily use of broad spectrum spf 30+ sunscreen to sun-exposed areas.   HISTORY OF BASAL CELL CARCINOMA OF THE SKIN - No evidence of recurrence today - Recommend regular full body skin exams - Recommend daily broad spectrum sunscreen SPF 30+ to sun-exposed areas, reapply every 2 hours as needed.  - Call if any new or changing lesions are noted between office visits  Neoplasm of uncertain behavior of skin R distal dorsum forearm  Skin / nail biopsy Type of biopsy: tangential   Informed consent: discussed and consent obtained   Timeout: patient name, date of birth, surgical site, and procedure verified   Procedure prep:  Patient was prepped and draped in usual sterile fashion Prep type:  Isopropyl alcohol Anesthesia: the lesion was anesthetized in a standard fashion   Anesthetic:  1% lidocaine w/ epinephrine 1-100,000 buffered w/ 8.4% NaHCO3 Instrument used: flexible razor blade   Hemostasis achieved with: pressure, aluminum chloride and electrodesiccation   Outcome: patient tolerated procedure well   Post-procedure details: sterile dressing applied and wound care instructions given   Dressing type: bandage and petrolatum    Specimen 1 - Surgical pathology Differential Diagnosis: D48.5 R/O ISK vs lentigo maligna Check Margins: No   Acrochordons (Skin Tags) - Fleshy, skin-colored pedunculated papules - Benign appearing.  - Observe. - If desired, they can be removed with an in office procedure that is not covered by insurance. - Please call the clinic if you notice any new or changing lesions.  Return in about 1 year (around 07/18/2024) for TBSE.  Maylene Roes, CMA, am acting as scribe for Armida Sans, MD .  Documentation: I have reviewed the above documentation for accuracy and completeness, and I agree with the  above.  Armida Sans, MD

## 2023-07-19 NOTE — Patient Instructions (Addendum)
Wound Care Instructions  Cleanse wound gently with soap and water once a day then pat dry with clean gauze. Apply a thin coat of Petrolatum (petroleum jelly, "Vaseline") over the wound (unless you have an allergy to this). We recommend that you use a new, sterile tube of Vaseline. Do not pick or remove scabs. Do not remove the yellow or white "healing tissue" from the base of the wound.  Cover the wound with fresh, clean, nonstick gauze and secure with paper tape. You may use Band-Aids in place of gauze and tape if the wound is small enough, but would recommend trimming much of the tape off as there is often too much. Sometimes Band-Aids can irritate the skin.  You should call the office for your biopsy report after 1 week if you have not already been contacted.  If you experience any problems, such as abnormal amounts of bleeding, swelling, significant bruising, significant pain, or evidence of infection, please call the office immediately.  FOR ADULT SURGERY PATIENTS: If you need something for pain relief you may take 1 extra strength Tylenol (acetaminophen) AND 2 Ibuprofen (200mg each) together every 4 hours as needed for pain. (do not take these if you are allergic to them or if you have a reason you should not take them.) Typically, you may only need pain medication for 1 to 3 days.     Due to recent changes in healthcare laws, you may see results of your pathology and/or laboratory studies on MyChart before the doctors have had a chance to review them. We understand that in some cases there may be results that are confusing or concerning to you. Please understand that not all results are received at the same time and often the doctors may need to interpret multiple results in order to provide you with the best plan of care or course of treatment. Therefore, we ask that you please give us 2 business days to thoroughly review all your results before contacting the office for clarification. Should  we see a critical lab result, you will be contacted sooner.   If You Need Anything After Your Visit  If you have any questions or concerns for your doctor, please call our main line at [phone] and press option 4 to reach your doctor's medical assistant. If no one answers, please leave a voicemail as directed and we will return your call as soon as possible. Messages left after 4 pm will be answered the following business day.   You may also send us a message via MyChart. We typically respond to MyChart messages within 1-2 business days.  For prescription refills, please ask your pharmacy to contact our office. Our fax number is [phone].  If you have an urgent issue when the clinic is closed that cannot wait until the next business day, you can page your doctor at the number below.    Please note that while we do our best to be available for urgent issues outside of office hours, we are not available 24/7.   If you have an urgent issue and are unable to reach us, you may choose to seek medical care at your doctor's office, retail clinic, urgent care center, or emergency room.  If you have a medical emergency, please immediately call 911 or go to the emergency department.  Pager Numbers  - Dr. Kowalski: [phone]  - Dr. Moye: [phone]  - Dr. Stewart: [phone]  In the event of inclement weather, please call our main line at   [phone] for an update on the status of any delays or closures.  Dermatology Medication Tips: Please keep the boxes that topical medications come in in order to help keep track of the instructions about where and how to use these. Pharmacies typically print the medication instructions only on the boxes and not directly on the medication tubes.   If your medication is too expensive, please contact our office at [phone] option 4 or send us a message through MyChart.   We are unable to tell what your co-pay for medications will be in  advance as this is different depending on your insurance coverage. However, we may be able to find a substitute medication at lower cost or fill out paperwork to get insurance to cover a needed medication.   If a prior authorization is required to get your medication covered by your insurance company, please allow us 1-2 business days to complete this process.  Drug prices often vary depending on where the prescription is filled and some pharmacies may offer cheaper prices.  The website www.goodrx.com contains coupons for medications through different pharmacies. The prices here do not account for what the cost may be with help from insurance (it may be cheaper with your insurance), but the website can give you the price if you did not use any insurance.  - You can print the associated coupon and take it with your prescription to the pharmacy.  - You may also stop by our office during regular business hours and pick up a GoodRx coupon card.  - If you need your prescription sent electronically to a different pharmacy, notify our office through [facility] MyChart or by phone at [phone] option 4.     Si Usted Necesita Algo Despus de Su Visita  Tambin puede enviarnos un mensaje a travs de MyChart. Por lo general respondemos a los mensajes de MyChart en el transcurso de 1 a 2 das hbiles.  Para renovar recetas, por favor pida a su farmacia que se ponga en contacto con nuestra oficina. Nuestro nmero de fax es el [phone].  Si tiene un asunto urgente cuando la clnica est cerrada y que no puede esperar hasta el siguiente da hbil, puede llamar/localizar a su doctor(a) al nmero que aparece a continuacin.   Por favor, tenga en cuenta que aunque hacemos todo lo posible para estar disponibles para asuntos urgentes fuera del horario de oficina, no estamos disponibles las 24 horas del da, los 7 das de la semana.   Si tiene un problema urgente y no puede comunicarse con nosotros, puede  optar por buscar atencin mdica  en el consultorio de su doctor(a), en una clnica privada, en un centro de atencin urgente o en una sala de emergencias.  Si tiene una emergencia mdica, por favor llame inmediatamente al 911 o vaya a la sala de emergencias.  Nmeros de bper  - Dr. Kowalski: [phone]  - Dra. Moye: [phone]  - Dra. Stewart: [phone]  En caso de inclemencias del tiempo, por favor llame a nuestra lnea principal al [phone] para una actualizacin sobre el estado de cualquier retraso o cierre.  Consejos para la medicacin en dermatologa: Por favor, guarde las cajas en las que vienen los medicamentos de uso tpico para ayudarle a seguir las instrucciones sobre dnde y cmo usarlos. Las farmacias generalmente imprimen las instrucciones del medicamento slo en las cajas y no directamente en los tubos del medicamento.   Si su medicamento es muy caro, por favor, pngase en contacto con   nuestra oficina llamando al [phone] y presione la opcin 4 o envenos un mensaje a travs de MyChart.   No podemos decirle cul ser su copago por los medicamentos por adelantado ya que esto es diferente dependiendo de la cobertura de su seguro. Sin embargo, es posible que podamos encontrar un medicamento sustituto a menor costo o llenar un formulario para que el seguro cubra el medicamento que se considera necesario.   Si se requiere una autorizacin previa para que su compaa de seguros cubra su medicamento, por favor permtanos de 1 a 2 das hbiles para completar este proceso.  Los precios de los medicamentos varan con frecuencia dependiendo del lugar de dnde se surte la receta y alguna farmacias pueden ofrecer precios ms baratos.  El sitio web www.goodrx.com tiene cupones para medicamentos de diferentes farmacias. Los precios aqu no tienen en cuenta lo que podra costar con la ayuda del seguro (puede ser ms barato con su seguro), pero el sitio web puede darle el  precio si no utiliz ningn seguro.  - Puede imprimir el cupn correspondiente y llevarlo con su receta a la farmacia.  - Tambin puede pasar por nuestra oficina durante el horario de atencin regular y recoger una tarjeta de cupones de GoodRx.  - Si necesita que su receta se enve electrnicamente a una farmacia diferente, informe a nuestra oficina a travs de MyChart de [facility] o por telfono llamando al [phone] y presione la opcin 4.  

## 2023-08-02 ENCOUNTER — Telehealth: Payer: Self-pay

## 2023-08-02 NOTE — Telephone Encounter (Signed)
-----   Message from Armida Sans sent at 07/27/2023  5:26 PM EDT ----- Diagnosis Skin , right distal dorsum forearm PIGMENTED SEBORRHEIC KERATOSIS, EARLY AND LENTIGO, IRRITATED  Benign keratosis No further treatment needed

## 2023-08-02 NOTE — Telephone Encounter (Signed)
Advised pt of bx results/sh ?

## 2023-11-06 ENCOUNTER — Ambulatory Visit: Payer: Self-pay

## 2023-11-06 DIAGNOSIS — Z23 Encounter for immunization: Secondary | ICD-10-CM

## 2023-11-06 DIAGNOSIS — Z719 Counseling, unspecified: Secondary | ICD-10-CM

## 2023-11-06 NOTE — Progress Notes (Signed)
Pt in nurse clinic requesting Covid Moderna vaccine. Eligible per [facility], given VIS, administered for 15 min without problems. Given [facility] copy, explained and understood. M.Morell Mears, LPN.

## 2024-03-18 ENCOUNTER — Telehealth: Payer: Self-pay

## 2024-03-18 NOTE — Telephone Encounter (Signed)
 Returned call from client regarding MMR vaccine.  Client with no proof of MMR vaccine, and does some international travel, no trips planned in the next few months.  Information regarding  MMR vaccination as found on CDC shared with client.  Client will call back to schedule at a later time.  Teaira Croft R Azari Hasler

## 2024-08-06 ENCOUNTER — Encounter: Payer: Self-pay | Admitting: Dermatology

## 2024-08-06 ENCOUNTER — Ambulatory Visit (INDEPENDENT_AMBULATORY_CARE_PROVIDER_SITE_OTHER): Payer: Self-pay | Admitting: Dermatology

## 2024-08-06 DIAGNOSIS — B356 Tinea cruris: Secondary | ICD-10-CM

## 2024-08-06 DIAGNOSIS — D229 Melanocytic nevi, unspecified: Secondary | ICD-10-CM

## 2024-08-06 DIAGNOSIS — B353 Tinea pedis: Secondary | ICD-10-CM

## 2024-08-06 DIAGNOSIS — L814 Other melanin hyperpigmentation: Secondary | ICD-10-CM

## 2024-08-06 DIAGNOSIS — L859 Epidermal thickening, unspecified: Secondary | ICD-10-CM

## 2024-08-06 DIAGNOSIS — Z7189 Other specified counseling: Secondary | ICD-10-CM

## 2024-08-06 DIAGNOSIS — Z85828 Personal history of other malignant neoplasm of skin: Secondary | ICD-10-CM

## 2024-08-06 DIAGNOSIS — Z1283 Encounter for screening for malignant neoplasm of skin: Secondary | ICD-10-CM

## 2024-08-06 DIAGNOSIS — L578 Other skin changes due to chronic exposure to nonionizing radiation: Secondary | ICD-10-CM

## 2024-08-06 DIAGNOSIS — W908XXA Exposure to other nonionizing radiation, initial encounter: Secondary | ICD-10-CM

## 2024-08-06 DIAGNOSIS — L821 Other seborrheic keratosis: Secondary | ICD-10-CM

## 2024-08-06 DIAGNOSIS — B354 Tinea corporis: Secondary | ICD-10-CM

## 2024-08-06 DIAGNOSIS — Z79899 Other long term (current) drug therapy: Secondary | ICD-10-CM

## 2024-08-06 DIAGNOSIS — L304 Erythema intertrigo: Secondary | ICD-10-CM

## 2024-08-06 NOTE — Progress Notes (Signed)
 Follow-Up Visit   Subjective  Holly Holloway is a 63 y.o. female who presents for the following: Skin Cancer Screening and Full Body Skin Exam. Hx of BCC.   The patient presents for Total-Body Skin Exam (TBSE) for skin cancer screening and mole check. The patient has spots, moles and lesions to be evaluated, some may be new or changing and the patient may have concern these could be cancer.  The following portions of the chart were reviewed this encounter and updated as appropriate: medications, allergies, medical history  Review of Systems:  No other skin or systemic complaints except as noted in HPI or Assessment and Plan.  Objective  Well appearing patient in no apparent distress; mood and affect are within normal limits.  A full examination was performed including scalp, head, eyes, ears, nose, lips, neck, chest, axillae, abdomen, back, buttocks, bilateral upper extremities, bilateral lower extremities, hands, feet, fingers, toes, fingernails, and toenails. All findings within normal limits unless otherwise noted below.   Relevant physical exam findings are noted in the Assessment and Plan.   Assessment & Plan   SKIN CANCER SCREENING PERFORMED TODAY.  HISTORY OF BASAL CELL CARCINOMA OF THE SKIN. left distal dorsum forearm lateral. 08/29/2016. - No evidence of recurrence today - Recommend regular full body skin exams - Recommend daily broad spectrum sunscreen SPF 30+ to sun-exposed areas, reapply every 2 hours as needed.  - Call if any new or changing lesions are noted between office visits  ACTINIC DAMAGE - Chronic condition, secondary to cumulative UV/sun exposure - diffuse scaly erythematous macules with underlying dyspigmentation - Recommend daily broad spectrum sunscreen SPF 30+ to sun-exposed areas, reapply every 2 hours as needed.  - Staying in the shade or wearing long sleeves, sun glasses (UVA+UVB protection) and wide brim hats (4-inch brim around the entire  circumference of the hat) are also recommended for sun protection.  - Call for new or changing lesions.  LENTIGINES, SEBORRHEIC KERATOSES, HEMANGIOMAS - Benign normal skin lesions - Benign-appearing - Call for any changes  MELANOCYTIC NEVI - Tan-brown and/or pink-flesh-colored symmetric macules and papules - Benign appearing on exam today - Observation - Call clinic for new or changing moles - Recommend daily use of broad spectrum spf 30+ sunscreen to sun-exposed areas.   Sebaceous Hyperplasia - Small yellow papules with a central dell at forehead - Benign-appearing - Observe. Call for changes.   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 Condition is bothersome/symptomatic for patient. Currently flared. Intertrigo is a chronic recurrent rash that occurs in skin fold areas that may be associated with friction; heat; moisture; yeast; fungus; and bacteria.  It is exacerbated by increased movement / activity; sweating; and higher atmospheric temperature.  Use of an absorbant powder such as Zeasorb AF powder or other OTC antifungal powder to the area daily can prevent rash recurrence. Other options to help keep the area dry include blow drying the area after bathing or using antiperspirant products such as Duradry sweat minimizing gel. Treatment Plan: Ketoconazole  cream daily.   Tinea Cruris / corporis Chronic and persistent condition with duration or expected duration over one year. Condition is symptomatic / bothersome to patient. Not to goal. Exam: erythematous scaly patch at left buttocks.  Treatment:  Ketoconazole  2% cream apply daily to feet, breast folds and left buttock Pt declines oral treatment today.  TINEA PEDIS Exam: Scaling and maceration web spaces and over distal and lateral  soles. Treatment Plan: Ketoconazole  2% cream apply daily to feet Patient defers Rx today.  States she will call when needs refills.     Return in about 1 year (around 08/06/2025) for TBSE, Hx BCC.  I, Jill Parcell, CMA, am acting as scribe for Alm Rhyme, MD.   Documentation: I have reviewed the above documentation for accuracy and completeness, and I agree with the above.  Alm Rhyme, MD

## 2024-08-06 NOTE — Patient Instructions (Addendum)
 Ketoconazole  2% cream apply daily to feet, breast folds and left buttock    Recommend daily broad spectrum sunscreen SPF 30+ to sun-exposed areas, reapply every 2 hours as needed. Call for new or changing lesions.  Staying in the shade or wearing long sleeves, sun glasses (UVA+UVB protection) and wide brim hats (4-inch brim around the entire circumference of the hat) are also recommended for sun protection.      Melanoma ABCDEs  Melanoma is the most dangerous type of skin cancer, and is the leading cause of death from skin disease.  You are more likely to develop melanoma if you: Have light-colored skin, light-colored eyes, or red or blond hair Spend a lot of time in the sun Tan regularly, either outdoors or in a tanning bed Have had blistering sunburns, especially during childhood Have a close family member who has had a melanoma Have atypical moles or large birthmarks  Early detection of melanoma is key since treatment is typically straightforward and cure rates are extremely high if we catch it early.   The first sign of melanoma is often a change in a mole or a new dark spot.  The ABCDE system is a way of remembering the signs of melanoma.  A for asymmetry:  The two halves do not match. B for border:  The edges of the growth are irregular. C for color:  A mixture of colors are present instead of an even brown color. D for diameter:  Melanomas are usually (but not always) greater than 6mm - the size of a pencil eraser. E for evolution:  The spot keeps changing in size, shape, and color.  Please check your skin once per month between visits. You can use a small mirror in front and a large mirror behind you to keep an eye on the back side or your body.   If you see any new or changing lesions before your next follow-up, please call to schedule a visit.  Please continue daily skin protection including broad spectrum sunscreen SPF 30+ to sun-exposed areas, reapplying every 2 hours as  needed when you're outdoors.   Staying in the shade or wearing long sleeves, sun glasses (UVA+UVB protection) and wide brim hats (4-inch brim around the entire circumference of the hat) are also recommended for sun protection.      Due to recent changes in healthcare laws, you may see results of your pathology and/or laboratory studies on MyChart before the doctors have had a chance to review them. We understand that in some cases there may be results that are confusing or concerning to you. Please understand that not all results are received at the same time and often the doctors may need to interpret multiple results in order to provide you with the best plan of care or course of treatment. Therefore, we ask that you please give us  2 business days to thoroughly review all your results before contacting the office for clarification. Should we see a critical lab result, you will be contacted sooner.   If You Need Anything After Your Visit  If you have any questions or concerns for your doctor, please call our main line at [phone] and press option 4 to reach your doctor's medical assistant. If no one answers, please leave a voicemail as directed and we will return your call as soon as possible. Messages left after 4 pm will be answered the following business day.   You may also send us  a message via MyChart. We typically  respond to MyChart messages within 1-2 business days.  For prescription refills, please ask your pharmacy to contact our office. Our fax number is [phone].  If you have an urgent issue when the clinic is closed that cannot wait until the next business day, you can page your doctor at the number below.    Please note that while we do our best to be available for urgent issues outside of office hours, we are not available 24/7.   If you have an urgent issue and are unable to reach us , you may choose to seek medical care at your doctor's office, retail clinic, urgent care  center, or emergency room.  If you have a medical emergency, please immediately call 911 or go to the emergency department.  Pager Numbers  - Dr. Hester: [phone]  - Dr. Jackquline: [phone]  - Dr. Claudene: [phone]   - Dr. Raymund: [phone]  In the event of inclement weather, please call our main line at [phone] for an update on the status of any delays or closures.  Dermatology Medication Tips: Please keep the boxes that topical medications come in in order to help keep track of the instructions about where and how to use these. Pharmacies typically print the medication instructions only on the boxes and not directly on the medication tubes.   If your medication is too expensive, please contact our office at [phone] option 4 or send us  a message through MyChart.   We are unable to tell what your co-pay for medications will be in advance as this is different depending on your insurance coverage. However, we may be able to find a substitute medication at lower cost or fill out paperwork to get insurance to cover a needed medication.   If a prior authorization is required to get your medication covered by your insurance company, please allow us  1-2 business days to complete this process.  Drug prices often vary depending on where the prescription is filled and some pharmacies may offer cheaper prices.  The website www.goodrx.com contains coupons for medications through different pharmacies. The prices here do not account for what the cost may be with help from insurance (it may be cheaper with your insurance), but the website can give you the price if you did not use any insurance.  - You can print the associated coupon and take it with your prescription to the pharmacy.  - You may also stop by our office during regular business hours and pick up a GoodRx coupon card.  - If you need your prescription sent electronically to a different pharmacy, notify our office  through [facility] or by phone at [phone] option 4.     Si Usted Necesita Algo Despus de Su Visita  Tambin puede enviarnos un mensaje a travs de Clinical cytogeneticist. Por lo general respondemos a los mensajes de MyChart en el transcurso de 1 a 2 das hbiles.  Para renovar recetas, por favor pida a su farmacia que se ponga en contacto con nuestra oficina. Randi lakes de fax es [place] [phone].  Si tiene un asunto urgente cuando la clnica est cerrada y que no puede esperar hasta el siguiente da hbil, puede llamar/localizar a su doctor(a) al nmero que aparece a continuacin.   Por favor, tenga en cuenta que aunque hacemos todo lo posible para estar disponibles para asuntos urgentes fuera del horario de [place], no estamos disponibles las 24 horas del da, los 7 [address]  [address] de la [place].   Si tiene un problema  urgente y no puede comunicarse con nosotros, puede optar por buscar atencin mdica  en el consultorio de su doctor(a), en una clnica privada, en un centro de atencin urgente o en una sala de emergencias.  Si tiene Engineer, drilling, por favor llame inmediatamente al 911 o vaya a la sala de emergencias.  Nmeros de bper  - Dr. Hester: [phone]  - Dra. Jackquline: [phone]  - Dr. Claudene: [phone]  - Dra. Kitts: [phone]  En caso de inclemencias del [place], por favor llame a nuestra lnea principal al [phone] para una actualizacin sobre el estado de cualquier retraso o cierre.  Consejos para la medicacin en dermatologa: Por favor, guarde las cajas en las que vienen los medicamentos de uso tpico para ayudarle a seguir las instrucciones sobre dnde y cmo usarlos. Las farmacias generalmente imprimen las instrucciones del medicamento slo en las cajas y no directamente en los tubos del [place].   Si su medicamento es muy caro, por favor, pngase en contacto con landry rieger llamando al [phone] y presione la opcin 4 o envenos un mensaje  a travs de Clinical cytogeneticist.   No podemos decirle cul ser su copago por los medicamentos por adelantado ya que esto es diferente dependiendo de la cobertura de su seguro. Sin embargo, es posible que podamos encontrar un medicamento sustituto a Audiological scientist un formulario para que el seguro cubra el medicamento que se considera necesario.   Si se requiere una autorizacin previa para que su compaa de seguros malta su medicamento, por favor permtanos de 1 a 2 das hbiles para completar este proceso.  Los precios de los medicamentos varan con frecuencia dependiendo del Environmental consultant de dnde se surte la receta y alguna farmacias pueden ofrecer precios ms baratos.  El sitio web www.goodrx.com tiene cupones para medicamentos de Health and safety inspector. Los precios aqu no tienen en cuenta lo que podra costar con la ayuda del seguro (puede ser ms barato con su seguro), pero el sitio web puede darle el precio si no utiliz Tourist information centre manager.  - Puede imprimir el cupn correspondiente y llevarlo con su receta a la farmacia.  - Tambin puede pasar por nuestra oficina durante el horario de atencin regular y Education officer, museum una tarjeta de cupones de GoodRx.  - Si necesita que su receta se enve electrnicamente a una farmacia diferente, informe a nuestra oficina a travs de MyChart de [place] o por telfono llamando al [phone] y presione la opcin 4.

## 2024-08-07 ENCOUNTER — Ambulatory Visit: Payer: Self-pay | Admitting: Dermatology

## 2024-08-13 ENCOUNTER — Ambulatory Visit: Payer: Self-pay | Admitting: Dermatology

## 2025-08-06 ENCOUNTER — Ambulatory Visit: Payer: Self-pay | Admitting: Dermatology
